# Patient Record
Sex: Male | Born: 1935 | Race: White | Hispanic: No | Smoking: Never smoker
Health system: Southern US, Community
[De-identification: ages and names within clinical notes are randomized; demographics above are authoritative.]

## PROBLEM LIST (undated history)

## (undated) DIAGNOSIS — R55 Syncope and collapse: Secondary | ICD-10-CM

## (undated) DIAGNOSIS — R7989 Other specified abnormal findings of blood chemistry: Secondary | ICD-10-CM

## (undated) DIAGNOSIS — F0390 Unspecified dementia without behavioral disturbance: Secondary | ICD-10-CM

## (undated) DIAGNOSIS — N182 Chronic kidney disease, stage 2 (mild): Secondary | ICD-10-CM

## (undated) DIAGNOSIS — I5032 Chronic diastolic (congestive) heart failure: Secondary | ICD-10-CM

## (undated) DIAGNOSIS — J449 Chronic obstructive pulmonary disease, unspecified: Secondary | ICD-10-CM

## (undated) DIAGNOSIS — N289 Disorder of kidney and ureter, unspecified: Secondary | ICD-10-CM

## (undated) DIAGNOSIS — Z9289 Personal history of other medical treatment: Secondary | ICD-10-CM

## (undated) DIAGNOSIS — I2699 Other pulmonary embolism without acute cor pulmonale: Secondary | ICD-10-CM

## (undated) DIAGNOSIS — I4891 Unspecified atrial fibrillation: Secondary | ICD-10-CM

## (undated) HISTORY — DX: Chronic obstructive pulmonary disease, unspecified: J44.9

---

## 1999-02-01 ENCOUNTER — Other Ambulatory Visit: Admission: RE | Admit: 1999-02-01 | Discharge: 1999-02-01 | Payer: Self-pay | Admitting: Urology

## 1999-02-25 ENCOUNTER — Encounter: Admission: RE | Admit: 1999-02-25 | Discharge: 1999-05-26 | Payer: Self-pay | Admitting: Radiation Oncology

## 1999-04-30 ENCOUNTER — Encounter: Payer: Self-pay | Admitting: Urology

## 1999-04-30 ENCOUNTER — Encounter: Admission: RE | Admit: 1999-04-30 | Discharge: 1999-04-30 | Payer: Self-pay | Admitting: Urology

## 1999-05-07 ENCOUNTER — Encounter: Payer: Self-pay | Admitting: Urology

## 1999-05-07 ENCOUNTER — Ambulatory Visit (HOSPITAL_BASED_OUTPATIENT_CLINIC_OR_DEPARTMENT_OTHER): Admission: RE | Admit: 1999-05-07 | Discharge: 1999-05-07 | Payer: Self-pay | Admitting: Urology

## 1999-05-28 ENCOUNTER — Encounter: Admission: RE | Admit: 1999-05-28 | Discharge: 1999-08-26 | Payer: Self-pay | Admitting: Radiation Oncology

## 2000-09-02 ENCOUNTER — Encounter (INDEPENDENT_AMBULATORY_CARE_PROVIDER_SITE_OTHER): Payer: Self-pay | Admitting: Specialist

## 2000-09-02 ENCOUNTER — Ambulatory Visit (HOSPITAL_COMMUNITY): Admission: RE | Admit: 2000-09-02 | Discharge: 2000-09-02 | Payer: Self-pay | Admitting: *Deleted

## 2000-10-30 ENCOUNTER — Encounter: Payer: Self-pay | Admitting: General Surgery

## 2000-11-03 ENCOUNTER — Observation Stay (HOSPITAL_COMMUNITY): Admission: RE | Admit: 2000-11-03 | Discharge: 2000-11-04 | Payer: Self-pay | Admitting: General Surgery

## 2001-11-29 ENCOUNTER — Emergency Department (HOSPITAL_COMMUNITY): Admission: EM | Admit: 2001-11-29 | Discharge: 2001-11-30 | Payer: Self-pay | Admitting: Emergency Medicine

## 2001-11-30 ENCOUNTER — Encounter: Payer: Self-pay | Admitting: Emergency Medicine

## 2008-12-21 ENCOUNTER — Ambulatory Visit (HOSPITAL_COMMUNITY): Admission: RE | Admit: 2008-12-21 | Discharge: 2008-12-21 | Payer: Self-pay

## 2008-12-21 ENCOUNTER — Encounter (INDEPENDENT_AMBULATORY_CARE_PROVIDER_SITE_OTHER): Payer: Self-pay | Admitting: Family Medicine

## 2010-08-16 NOTE — Procedures (Signed)
[place]. [facility]  Patient:    Craig Bennett, Craig Bennett                        MRN: [id number] Proc. Date: 09/02/00 Adm. Date:  [id number] Attending:  Sabino Gasser                           Procedure Report  PROCEDURE PERFORMED:  Colonoscopy.  ENDOSCOPIST:  Sabino Gasser, M.D.  INDICATIONS FOR PROCEDURE:  Gastrointestinal bleeding.  ANESTHESIA:  None given at patients request.  The patient did not have a ride home.  DESCRIPTION OF PROCEDURE:  With the patient in the left lateral decubitus position and subsequently rolled to his back, his right side, back and various positions with abdominal pressure applied in various locations, the Olympus videoscopic pediatric colonoscope variable stiffness PCF160 was inserted in the rectum after rectal examination was unremarkable and passed under direct vision to what appeared to be the right colon near the hepatic flexure but we could not get past this point in spite of abdominal pressure.  The colonoscope continued to loop and was not able to be passed.  The endoscope was slowly withdrawn at the patients request, taking circumferential views of the entire colonic mucosa stopping only in the rectum where the area was slightly bloody which may have been from hemorrhoids or from endoscope trauma to this area. At any rate, we biopsied it to rule out radiation proctitis.  The endoscope was straightened and withdrawn.  Patients vital signs and pulse oximeter remained stable.  The patient tolerated the procedure well and without apparent complications.  FINDINGS:  Hemorrhoids.  Blood in rectal vault, scant amount, may have been trauma but biopsies taken to rule out radiation proctitis.  The patient will call me for results and follow up with me as an outpatient.  Consider barium enema. DD:  09/02/00 TD:  09/02/00 Job: [id number]

## 2010-08-16 NOTE — Op Note (Signed)
[facility]  Patient:    Craig Bennett, Craig Bennett                        MRN: [id number] Proc. Date: 11/03/00 Adm. Date:  [id number] Attending:  Arlis Porta CC:         Veverly Fells. Vernie Ammons, M.D.  Jaclyn Prime. Lucas Mallow, M.D.   Operative Report  PREOPERATIVE DIAGNOSIS:  Bilateral inguinal hernia.  POSTOPERATIVE DIAGNOSIS:  Bilateral direct inguinal hernia.  PROCEDURE:  Laparoscopic repair of bilateral direct inguinal hernia with mesh.  SURGEON:  Adolph Pollack, M.D.  ANESTHESIA:  General.  INDICATIONS FOR PROCEDURE:  Mr. Albarran is a 77 year old male with chronic atrial fibrillation who noted a bulge in the right inguinal area that was causing him some pain. On examination, he has not only an obvious right inguinal hernia but also a left inguinal hernia. He was admitted for elective repair.  TECHNIQUE:  He was placed supine on the operating table and a general anesthetic was administered. A Foley catheter was placed in his bladder. His lower abdomen was sterilely prepped and draped. A local anesthetic consisting of Marcaine was infiltrated in the subumbilical region and a transverse subumbilical incision was made incising the skin and subcutaneous tissue sharply. The right anterior rectus sheath was identified and a 1-1.5 cm longitudinal incision was made exposing the underlying rectus muscle. The underlying rectus muscle was swept laterally exposing the posterior rectus sheath and a plane is created into the extraperitoneal space bluntly. A balloon dissection device placed into the extraperitoneal space and under direct vision balloon dissection was performed in the extraperitoneal space inferior to the umbilicus. Once this was done, the balloon was let down, removed and a trocar was placed in the extraperitoneal space and CO2 gas insufflated in order to provide a working space. The laparoscope was then introduced. I started with the right side.  Under  direct vision, two 5 mm trocars were placed in the midline and Coopers ligament was exposed on the right side. There was obvious direct inguinal hernia on the right side and the extra peritoneal fatty contents were reduced. The spermatic cord was identified and no indirect component was noted. The spermatic cord was isolated. The anterior and lateral aspects of the abdominal wall in the infraumbilical region were dissected free and exposed.  Next, I approached the left side. I identified  Coopers ligament and a small direct inguinal hernia. The extraperitoneal fatty contents were reduced from it. Subsequent to this, the spermatic cord was identified, isolated and no indirect defect noted. There was a lipoma at the cord that was reduced. I then exposed the anterior and lateral abdominal walls using blunt dissection. A piece of 4 x 6 inch mesh with a longitudinal slit cut into it was then placed into the left extraperitoneal space and anchored to Coopers ligament in the anterior abdominal wall. The slit was used to create a new internal ring and there was a superior tail and inferior tail with respect to the spermatic cord. The lateral aspect of the mesh was anchored to the anterior abdominal wall. This piece of mesh allowed for adequate coverage of the direct and indirect internal spaces.  Next, a similar size piece of mesh with a longitudinal slit cut into it was placed in the right extraperitoneal space and anchored to  Coopers ligament in the anterior abdominal wall. The spermatic cord was encircled with the slit cut in the mesh and the lateral aspect  was anchored to the anterior abdominal wall. This mesh provided adequate coverage of the direct, indirect, and femoral spaces. Both the hernia spaces were well covered with overlapping mesh.  I then held down the inferolateral aspects of both mesh and released the CO2 gas. Of note was that there was a small tear in the peritoneum and this  was repaired with staples. Once the CO2 gas was released, the trocars were removed.  The right anterior sheath rectus defect was closed with #0 Vicryl suture. The skin incisions were then closed with 4-0 monocryl subcuticular stitches followed by Steri-Strips and sterile dressings.  The patient tolerated the procedure well without any apparent complications. He was taken to the recovery room in satisfactory condition. I talked with Dr. Lucas Mallow, his cardiologist, before this because of his chronic atrial fibrillation and he thought that routine postoperative care would be all that was needed. Mr. Skoog did not have any adequate care at home so he will be staying the night on telemetry. DD:  11/03/00 TD:  11/04/00 Job: [id number]

## 2010-08-16 NOTE — Op Note (Signed)
[place]. [facility]  Patient:    Craig Bennett, NHAN                       MRN: [id number] Proc. Date: 05/07/99 Attending:  Loraine Leriche C. Vernie Ammons, M.D. CC:         Billie Lade, M.D.                           Operative Report  PREOPERATIVE DIAGNOSIS:  Adenocarcinoma of the prostate.  POSTOPERATIVE DIAGNOSIS:  Adenocarcinoma of the prostate.  PROCEDURE:  Radioactive iodine-125 seed implantation.  SURGEON:  Mark C. Vernie Ammons, M.D.  RADIATION ONCOLOGIST:  Billie Lade, M.D.  ANESTHESIA:  General.  DRAINS:  A 16-French Foley catheter.  NUMBER OF SEEDS:  116.  NUMBER OF NEEDLES:  26.  COMPLICATIONS:  None.  INDICATIONS:  The patient is a 77 year old white male with biopsy proven adenocarcinoma of the prostate, Gleason VI from the right lobe with a PSA of 11.8. He is brought to the OR today for radioactive seed implantation after having the risks, benefits, and alternatives to seed implant discussed which are fully outlined in my office notes, and been placed on this chart.  DESCRIPTION OF PROCEDURE:  After informed consent, the patient was brought to the major OR, and placed on the table and administered general anesthesia, and then  moved to modified lithotomy position with the perineum perpendicular to ______. A 16-French Foley catheter was inserted into the bladder.  Dilute contrast was used to fill the balloon, and Real-Time fluoroscopy was positioned, as well as the transrectal ultrasound probe.  6 mHz was used to image the prostate, and it was  placed on stabilizing stand.  The prostate was positioned into a position identical to that of the previous ultrasound mapping, and the seeds were then implanted using the ______ after the prostate was stabilized with two stabilizing needles. Real-Time fluoroscopy and Real-Time ultrasound were used to position the seeds. No complications occurred.  Flexible cystoscopy was then performed after all  seeds were implanted.  I noted the urethra to be intact.  The prostatic urethra revealed no evidence of seeds. However, there was a Vicryl strand protruding from the base of the prostate. There was a small medium lobe component, and therefore, the seed were protruding. There were grasped with forceps through the flexible scope and removed.  The strand contained six seeds.  The remainder of the bladder was inspected and had 1-2+ trabeculation, but no tumor, stones, or inflammatory lesions were seen.  The scope was retroflexed and the base of the prostate revealed no further evidence of seeds.  The cystoscope was removed and a 16-French Foley catheter was reinserted in the  bladder.  It will remain indwelling for 24 hours and then be removed.  Rectal exam was performed and revealed no evidence of seeds within the rectum.  The patient was given a prescription for Vicodin ES #28 and Cipro 500 mg #14, and will follow up in my office, as well as with Dr. Roselind Messier in three weeks. DD:  05/07/99 TD:  05/07/99 Job: [id number]

## 2011-10-27 DIAGNOSIS — I4891 Unspecified atrial fibrillation: Secondary | ICD-10-CM | POA: Diagnosis not present

## 2012-10-28 ENCOUNTER — Other Ambulatory Visit: Payer: Self-pay | Admitting: *Deleted

## 2012-10-28 MED ORDER — AMIODARONE HCL 200 MG PO TABS: 200.0000 mg | ORAL_TABLET | Freq: Every day | ORAL | Status: DC

## 2012-10-28 NOTE — Telephone Encounter (Signed)
Rx was sent to pharmacy electronically. 

## 2012-11-19 ENCOUNTER — Telehealth: Payer: Self-pay | Admitting: Cardiovascular Disease

## 2012-11-19 NOTE — Telephone Encounter (Signed)
Returned call.  No answer/voicemail.  Will await call back.

## 2012-11-19 NOTE — Telephone Encounter (Signed)
Returned call.  Line busy x 2.  Will try again later.  

## 2012-11-19 NOTE — Telephone Encounter (Signed)
Message from answering service from yesterday at 5:11 P.-Pt states he wants to talk to the doctor or nurse.They say they couldn't gett a reason for the  call.

## 2013-01-26 ENCOUNTER — Other Ambulatory Visit: Payer: Self-pay | Admitting: Cardiovascular Disease

## 2013-03-18 ENCOUNTER — Other Ambulatory Visit: Payer: Self-pay | Admitting: Cardiology

## 2013-03-22 NOTE — Telephone Encounter (Signed)
Rx was sent to pharmacy electronically. 

## 2013-04-14 DIAGNOSIS — IMO0002 Reserved for concepts with insufficient information to code with codable children: Secondary | ICD-10-CM | POA: Diagnosis not present

## 2013-04-14 DIAGNOSIS — S62319A Displaced fracture of base of unspecified metacarpal bone, initial encounter for closed fracture: Secondary | ICD-10-CM | POA: Diagnosis not present

## 2013-05-27 DIAGNOSIS — IMO0002 Reserved for concepts with insufficient information to code with codable children: Secondary | ICD-10-CM | POA: Diagnosis not present

## 2013-07-11 DIAGNOSIS — I10 Essential (primary) hypertension: Secondary | ICD-10-CM | POA: Diagnosis not present

## 2013-07-11 DIAGNOSIS — Z7901 Long term (current) use of anticoagulants: Secondary | ICD-10-CM | POA: Diagnosis not present

## 2013-07-11 DIAGNOSIS — I4891 Unspecified atrial fibrillation: Secondary | ICD-10-CM | POA: Diagnosis not present

## 2013-09-01 DIAGNOSIS — M810 Age-related osteoporosis without current pathological fracture: Secondary | ICD-10-CM | POA: Diagnosis not present

## 2013-09-01 DIAGNOSIS — I4891 Unspecified atrial fibrillation: Secondary | ICD-10-CM | POA: Diagnosis not present

## 2013-09-01 DIAGNOSIS — I10 Essential (primary) hypertension: Secondary | ICD-10-CM | POA: Diagnosis not present

## 2013-09-01 DIAGNOSIS — Z1331 Encounter for screening for depression: Secondary | ICD-10-CM | POA: Diagnosis not present

## 2013-09-01 DIAGNOSIS — Z0000 Encounter for general adult medical examination without abnormal findings: Secondary | ICD-10-CM | POA: Diagnosis not present

## 2013-09-01 DIAGNOSIS — N182 Chronic kidney disease, stage 2 (mild): Secondary | ICD-10-CM | POA: Diagnosis not present

## 2013-11-07 DIAGNOSIS — Z7901 Long term (current) use of anticoagulants: Secondary | ICD-10-CM | POA: Diagnosis not present

## 2013-11-07 DIAGNOSIS — I4891 Unspecified atrial fibrillation: Secondary | ICD-10-CM | POA: Diagnosis not present

## 2014-02-07 DIAGNOSIS — I482 Chronic atrial fibrillation: Secondary | ICD-10-CM | POA: Diagnosis not present

## 2014-02-07 DIAGNOSIS — Z7901 Long term (current) use of anticoagulants: Secondary | ICD-10-CM | POA: Diagnosis not present

## 2014-03-09 DIAGNOSIS — I482 Chronic atrial fibrillation: Secondary | ICD-10-CM | POA: Diagnosis not present

## 2014-03-09 DIAGNOSIS — Z7901 Long term (current) use of anticoagulants: Secondary | ICD-10-CM | POA: Diagnosis not present

## 2014-03-16 DIAGNOSIS — N182 Chronic kidney disease, stage 2 (mild): Secondary | ICD-10-CM | POA: Diagnosis not present

## 2014-03-16 DIAGNOSIS — Z125 Encounter for screening for malignant neoplasm of prostate: Secondary | ICD-10-CM | POA: Diagnosis not present

## 2014-03-16 DIAGNOSIS — Z8546 Personal history of malignant neoplasm of prostate: Secondary | ICD-10-CM | POA: Diagnosis not present

## 2014-03-16 DIAGNOSIS — I480 Paroxysmal atrial fibrillation: Secondary | ICD-10-CM | POA: Diagnosis not present

## 2014-03-16 DIAGNOSIS — M810 Age-related osteoporosis without current pathological fracture: Secondary | ICD-10-CM | POA: Diagnosis not present

## 2014-04-05 DIAGNOSIS — H25099 Other age-related incipient cataract, unspecified eye: Secondary | ICD-10-CM | POA: Diagnosis not present

## 2014-04-06 DIAGNOSIS — I482 Chronic atrial fibrillation: Secondary | ICD-10-CM | POA: Diagnosis not present

## 2014-04-06 DIAGNOSIS — Z7901 Long term (current) use of anticoagulants: Secondary | ICD-10-CM | POA: Diagnosis not present

## 2014-05-04 DIAGNOSIS — Z7901 Long term (current) use of anticoagulants: Secondary | ICD-10-CM | POA: Diagnosis not present

## 2014-05-04 DIAGNOSIS — I480 Paroxysmal atrial fibrillation: Secondary | ICD-10-CM | POA: Diagnosis not present

## 2014-06-15 DIAGNOSIS — I482 Chronic atrial fibrillation: Secondary | ICD-10-CM | POA: Diagnosis not present

## 2014-06-15 DIAGNOSIS — Z7901 Long term (current) use of anticoagulants: Secondary | ICD-10-CM | POA: Diagnosis not present

## 2014-06-20 DIAGNOSIS — M810 Age-related osteoporosis without current pathological fracture: Secondary | ICD-10-CM | POA: Diagnosis not present

## 2014-06-20 DIAGNOSIS — H6120 Impacted cerumen, unspecified ear: Secondary | ICD-10-CM | POA: Diagnosis not present

## 2014-06-20 DIAGNOSIS — F0390 Unspecified dementia without behavioral disturbance: Secondary | ICD-10-CM | POA: Diagnosis not present

## 2014-06-20 DIAGNOSIS — I480 Paroxysmal atrial fibrillation: Secondary | ICD-10-CM | POA: Diagnosis not present

## 2014-06-29 DIAGNOSIS — Z7901 Long term (current) use of anticoagulants: Secondary | ICD-10-CM | POA: Diagnosis not present

## 2014-06-29 DIAGNOSIS — I482 Chronic atrial fibrillation: Secondary | ICD-10-CM | POA: Diagnosis not present

## 2014-06-29 DIAGNOSIS — F0390 Unspecified dementia without behavioral disturbance: Secondary | ICD-10-CM | POA: Diagnosis not present

## 2014-06-29 DIAGNOSIS — I10 Essential (primary) hypertension: Secondary | ICD-10-CM | POA: Diagnosis not present

## 2014-06-29 DIAGNOSIS — Z9181 History of falling: Secondary | ICD-10-CM | POA: Diagnosis not present

## 2014-06-29 DIAGNOSIS — Z5181 Encounter for therapeutic drug level monitoring: Secondary | ICD-10-CM | POA: Diagnosis not present

## 2014-07-03 DIAGNOSIS — Z9181 History of falling: Secondary | ICD-10-CM | POA: Diagnosis not present

## 2014-07-03 DIAGNOSIS — Z5181 Encounter for therapeutic drug level monitoring: Secondary | ICD-10-CM | POA: Diagnosis not present

## 2014-07-03 DIAGNOSIS — F0390 Unspecified dementia without behavioral disturbance: Secondary | ICD-10-CM | POA: Diagnosis not present

## 2014-07-03 DIAGNOSIS — I10 Essential (primary) hypertension: Secondary | ICD-10-CM | POA: Diagnosis not present

## 2014-07-03 DIAGNOSIS — Z7901 Long term (current) use of anticoagulants: Secondary | ICD-10-CM | POA: Diagnosis not present

## 2014-07-03 DIAGNOSIS — I482 Chronic atrial fibrillation: Secondary | ICD-10-CM | POA: Diagnosis not present

## 2014-07-06 DIAGNOSIS — Z9181 History of falling: Secondary | ICD-10-CM | POA: Diagnosis not present

## 2014-07-06 DIAGNOSIS — I482 Chronic atrial fibrillation: Secondary | ICD-10-CM | POA: Diagnosis not present

## 2014-07-06 DIAGNOSIS — Z5181 Encounter for therapeutic drug level monitoring: Secondary | ICD-10-CM | POA: Diagnosis not present

## 2014-07-06 DIAGNOSIS — Z7901 Long term (current) use of anticoagulants: Secondary | ICD-10-CM | POA: Diagnosis not present

## 2014-07-06 DIAGNOSIS — I10 Essential (primary) hypertension: Secondary | ICD-10-CM | POA: Diagnosis not present

## 2014-07-06 DIAGNOSIS — F0390 Unspecified dementia without behavioral disturbance: Secondary | ICD-10-CM | POA: Diagnosis not present

## 2014-07-10 DIAGNOSIS — Z9181 History of falling: Secondary | ICD-10-CM | POA: Diagnosis not present

## 2014-07-10 DIAGNOSIS — Z7901 Long term (current) use of anticoagulants: Secondary | ICD-10-CM | POA: Diagnosis not present

## 2014-07-10 DIAGNOSIS — Z5181 Encounter for therapeutic drug level monitoring: Secondary | ICD-10-CM | POA: Diagnosis not present

## 2014-07-10 DIAGNOSIS — I482 Chronic atrial fibrillation: Secondary | ICD-10-CM | POA: Diagnosis not present

## 2014-07-10 DIAGNOSIS — F0390 Unspecified dementia without behavioral disturbance: Secondary | ICD-10-CM | POA: Diagnosis not present

## 2014-07-10 DIAGNOSIS — I10 Essential (primary) hypertension: Secondary | ICD-10-CM | POA: Diagnosis not present

## 2014-07-11 DIAGNOSIS — I10 Essential (primary) hypertension: Secondary | ICD-10-CM | POA: Diagnosis not present

## 2014-07-11 DIAGNOSIS — F0390 Unspecified dementia without behavioral disturbance: Secondary | ICD-10-CM | POA: Diagnosis not present

## 2014-07-11 DIAGNOSIS — Z7901 Long term (current) use of anticoagulants: Secondary | ICD-10-CM | POA: Diagnosis not present

## 2014-07-11 DIAGNOSIS — Z5181 Encounter for therapeutic drug level monitoring: Secondary | ICD-10-CM | POA: Diagnosis not present

## 2014-07-11 DIAGNOSIS — I482 Chronic atrial fibrillation: Secondary | ICD-10-CM | POA: Diagnosis not present

## 2014-07-11 DIAGNOSIS — Z9181 History of falling: Secondary | ICD-10-CM | POA: Diagnosis not present

## 2014-07-12 DIAGNOSIS — Z9181 History of falling: Secondary | ICD-10-CM | POA: Diagnosis not present

## 2014-07-12 DIAGNOSIS — Z7901 Long term (current) use of anticoagulants: Secondary | ICD-10-CM | POA: Diagnosis not present

## 2014-07-12 DIAGNOSIS — I10 Essential (primary) hypertension: Secondary | ICD-10-CM | POA: Diagnosis not present

## 2014-07-12 DIAGNOSIS — F0390 Unspecified dementia without behavioral disturbance: Secondary | ICD-10-CM | POA: Diagnosis not present

## 2014-07-12 DIAGNOSIS — Z5181 Encounter for therapeutic drug level monitoring: Secondary | ICD-10-CM | POA: Diagnosis not present

## 2014-07-12 DIAGNOSIS — I482 Chronic atrial fibrillation: Secondary | ICD-10-CM | POA: Diagnosis not present

## 2014-07-17 DIAGNOSIS — Z7901 Long term (current) use of anticoagulants: Secondary | ICD-10-CM | POA: Diagnosis not present

## 2014-07-17 DIAGNOSIS — I10 Essential (primary) hypertension: Secondary | ICD-10-CM | POA: Diagnosis not present

## 2014-07-17 DIAGNOSIS — F0390 Unspecified dementia without behavioral disturbance: Secondary | ICD-10-CM | POA: Diagnosis not present

## 2014-07-17 DIAGNOSIS — Z5181 Encounter for therapeutic drug level monitoring: Secondary | ICD-10-CM | POA: Diagnosis not present

## 2014-07-17 DIAGNOSIS — I482 Chronic atrial fibrillation: Secondary | ICD-10-CM | POA: Diagnosis not present

## 2014-07-17 DIAGNOSIS — Z9181 History of falling: Secondary | ICD-10-CM | POA: Diagnosis not present

## 2014-07-24 DIAGNOSIS — Z9181 History of falling: Secondary | ICD-10-CM | POA: Diagnosis not present

## 2014-07-24 DIAGNOSIS — Z5181 Encounter for therapeutic drug level monitoring: Secondary | ICD-10-CM | POA: Diagnosis not present

## 2014-07-24 DIAGNOSIS — I482 Chronic atrial fibrillation: Secondary | ICD-10-CM | POA: Diagnosis not present

## 2014-07-24 DIAGNOSIS — F0390 Unspecified dementia without behavioral disturbance: Secondary | ICD-10-CM | POA: Diagnosis not present

## 2014-07-24 DIAGNOSIS — Z7901 Long term (current) use of anticoagulants: Secondary | ICD-10-CM | POA: Diagnosis not present

## 2014-07-24 DIAGNOSIS — I10 Essential (primary) hypertension: Secondary | ICD-10-CM | POA: Diagnosis not present

## 2014-07-31 DIAGNOSIS — F0390 Unspecified dementia without behavioral disturbance: Secondary | ICD-10-CM | POA: Diagnosis not present

## 2014-07-31 DIAGNOSIS — I10 Essential (primary) hypertension: Secondary | ICD-10-CM | POA: Diagnosis not present

## 2014-07-31 DIAGNOSIS — Z5181 Encounter for therapeutic drug level monitoring: Secondary | ICD-10-CM | POA: Diagnosis not present

## 2014-07-31 DIAGNOSIS — Z7901 Long term (current) use of anticoagulants: Secondary | ICD-10-CM | POA: Diagnosis not present

## 2014-07-31 DIAGNOSIS — Z9181 History of falling: Secondary | ICD-10-CM | POA: Diagnosis not present

## 2014-07-31 DIAGNOSIS — I482 Chronic atrial fibrillation: Secondary | ICD-10-CM | POA: Diagnosis not present

## 2014-08-01 DIAGNOSIS — Z5181 Encounter for therapeutic drug level monitoring: Secondary | ICD-10-CM | POA: Diagnosis not present

## 2014-08-01 DIAGNOSIS — Z9181 History of falling: Secondary | ICD-10-CM | POA: Diagnosis not present

## 2014-08-01 DIAGNOSIS — I482 Chronic atrial fibrillation: Secondary | ICD-10-CM | POA: Diagnosis not present

## 2014-08-01 DIAGNOSIS — Z7901 Long term (current) use of anticoagulants: Secondary | ICD-10-CM | POA: Diagnosis not present

## 2014-08-01 DIAGNOSIS — I10 Essential (primary) hypertension: Secondary | ICD-10-CM | POA: Diagnosis not present

## 2014-08-01 DIAGNOSIS — F0390 Unspecified dementia without behavioral disturbance: Secondary | ICD-10-CM | POA: Diagnosis not present

## 2014-08-02 DIAGNOSIS — F0390 Unspecified dementia without behavioral disturbance: Secondary | ICD-10-CM | POA: Diagnosis not present

## 2014-08-02 DIAGNOSIS — I10 Essential (primary) hypertension: Secondary | ICD-10-CM | POA: Diagnosis not present

## 2014-08-02 DIAGNOSIS — Z7901 Long term (current) use of anticoagulants: Secondary | ICD-10-CM | POA: Diagnosis not present

## 2014-08-02 DIAGNOSIS — Z5181 Encounter for therapeutic drug level monitoring: Secondary | ICD-10-CM | POA: Diagnosis not present

## 2014-08-02 DIAGNOSIS — I482 Chronic atrial fibrillation: Secondary | ICD-10-CM | POA: Diagnosis not present

## 2014-08-02 DIAGNOSIS — Z9181 History of falling: Secondary | ICD-10-CM | POA: Diagnosis not present

## 2014-08-03 DIAGNOSIS — I480 Paroxysmal atrial fibrillation: Secondary | ICD-10-CM | POA: Diagnosis not present

## 2014-08-03 DIAGNOSIS — Z7901 Long term (current) use of anticoagulants: Secondary | ICD-10-CM | POA: Diagnosis not present

## 2014-08-07 DIAGNOSIS — Z5181 Encounter for therapeutic drug level monitoring: Secondary | ICD-10-CM | POA: Diagnosis not present

## 2014-08-07 DIAGNOSIS — Z7901 Long term (current) use of anticoagulants: Secondary | ICD-10-CM | POA: Diagnosis not present

## 2014-08-07 DIAGNOSIS — F0390 Unspecified dementia without behavioral disturbance: Secondary | ICD-10-CM | POA: Diagnosis not present

## 2014-08-07 DIAGNOSIS — I10 Essential (primary) hypertension: Secondary | ICD-10-CM | POA: Diagnosis not present

## 2014-08-07 DIAGNOSIS — I482 Chronic atrial fibrillation: Secondary | ICD-10-CM | POA: Diagnosis not present

## 2014-08-07 DIAGNOSIS — Z9181 History of falling: Secondary | ICD-10-CM | POA: Diagnosis not present

## 2014-08-09 DIAGNOSIS — Z5181 Encounter for therapeutic drug level monitoring: Secondary | ICD-10-CM | POA: Diagnosis not present

## 2014-08-09 DIAGNOSIS — Z9181 History of falling: Secondary | ICD-10-CM | POA: Diagnosis not present

## 2014-08-09 DIAGNOSIS — Z7901 Long term (current) use of anticoagulants: Secondary | ICD-10-CM | POA: Diagnosis not present

## 2014-08-09 DIAGNOSIS — I482 Chronic atrial fibrillation: Secondary | ICD-10-CM | POA: Diagnosis not present

## 2014-08-09 DIAGNOSIS — F0390 Unspecified dementia without behavioral disturbance: Secondary | ICD-10-CM | POA: Diagnosis not present

## 2014-08-09 DIAGNOSIS — I10 Essential (primary) hypertension: Secondary | ICD-10-CM | POA: Diagnosis not present

## 2014-08-11 DIAGNOSIS — Z7901 Long term (current) use of anticoagulants: Secondary | ICD-10-CM | POA: Diagnosis not present

## 2014-08-11 DIAGNOSIS — G309 Alzheimer's disease, unspecified: Secondary | ICD-10-CM | POA: Diagnosis not present

## 2014-08-15 DIAGNOSIS — F0390 Unspecified dementia without behavioral disturbance: Secondary | ICD-10-CM | POA: Diagnosis not present

## 2014-08-15 DIAGNOSIS — Z5181 Encounter for therapeutic drug level monitoring: Secondary | ICD-10-CM | POA: Diagnosis not present

## 2014-08-15 DIAGNOSIS — I482 Chronic atrial fibrillation: Secondary | ICD-10-CM | POA: Diagnosis not present

## 2014-08-15 DIAGNOSIS — Z9181 History of falling: Secondary | ICD-10-CM | POA: Diagnosis not present

## 2014-08-15 DIAGNOSIS — Z7901 Long term (current) use of anticoagulants: Secondary | ICD-10-CM | POA: Diagnosis not present

## 2014-08-15 DIAGNOSIS — I10 Essential (primary) hypertension: Secondary | ICD-10-CM | POA: Diagnosis not present

## 2014-08-16 DIAGNOSIS — Z9181 History of falling: Secondary | ICD-10-CM | POA: Diagnosis not present

## 2014-08-16 DIAGNOSIS — F0390 Unspecified dementia without behavioral disturbance: Secondary | ICD-10-CM | POA: Diagnosis not present

## 2014-08-16 DIAGNOSIS — Z7901 Long term (current) use of anticoagulants: Secondary | ICD-10-CM | POA: Diagnosis not present

## 2014-08-16 DIAGNOSIS — I482 Chronic atrial fibrillation: Secondary | ICD-10-CM | POA: Diagnosis not present

## 2014-08-16 DIAGNOSIS — Z5181 Encounter for therapeutic drug level monitoring: Secondary | ICD-10-CM | POA: Diagnosis not present

## 2014-08-16 DIAGNOSIS — I10 Essential (primary) hypertension: Secondary | ICD-10-CM | POA: Diagnosis not present

## 2014-08-17 DIAGNOSIS — Z7901 Long term (current) use of anticoagulants: Secondary | ICD-10-CM | POA: Diagnosis not present

## 2014-08-17 DIAGNOSIS — Z8546 Personal history of malignant neoplasm of prostate: Secondary | ICD-10-CM | POA: Diagnosis not present

## 2014-08-29 DIAGNOSIS — Z7901 Long term (current) use of anticoagulants: Secondary | ICD-10-CM | POA: Diagnosis not present

## 2014-08-29 DIAGNOSIS — I480 Paroxysmal atrial fibrillation: Secondary | ICD-10-CM | POA: Diagnosis not present

## 2014-09-15 DIAGNOSIS — Z1389 Encounter for screening for other disorder: Secondary | ICD-10-CM | POA: Diagnosis not present

## 2014-09-15 DIAGNOSIS — M810 Age-related osteoporosis without current pathological fracture: Secondary | ICD-10-CM | POA: Diagnosis not present

## 2014-09-15 DIAGNOSIS — Z0000 Encounter for general adult medical examination without abnormal findings: Secondary | ICD-10-CM | POA: Diagnosis not present

## 2014-09-15 DIAGNOSIS — Z125 Encounter for screening for malignant neoplasm of prostate: Secondary | ICD-10-CM | POA: Diagnosis not present

## 2014-09-15 DIAGNOSIS — R5383 Other fatigue: Secondary | ICD-10-CM | POA: Diagnosis not present

## 2014-09-15 DIAGNOSIS — I10 Essential (primary) hypertension: Secondary | ICD-10-CM | POA: Diagnosis not present

## 2014-09-15 DIAGNOSIS — I480 Paroxysmal atrial fibrillation: Secondary | ICD-10-CM | POA: Diagnosis not present

## 2014-09-15 DIAGNOSIS — I129 Hypertensive chronic kidney disease with stage 1 through stage 4 chronic kidney disease, or unspecified chronic kidney disease: Secondary | ICD-10-CM | POA: Diagnosis not present

## 2014-09-22 DIAGNOSIS — I4891 Unspecified atrial fibrillation: Secondary | ICD-10-CM | POA: Diagnosis not present

## 2014-09-22 DIAGNOSIS — I129 Hypertensive chronic kidney disease with stage 1 through stage 4 chronic kidney disease, or unspecified chronic kidney disease: Secondary | ICD-10-CM | POA: Diagnosis not present

## 2014-09-22 DIAGNOSIS — N182 Chronic kidney disease, stage 2 (mild): Secondary | ICD-10-CM | POA: Diagnosis not present

## 2014-09-22 DIAGNOSIS — I5032 Chronic diastolic (congestive) heart failure: Secondary | ICD-10-CM | POA: Diagnosis not present

## 2014-10-22 ENCOUNTER — Encounter (HOSPITAL_COMMUNITY): Payer: Self-pay | Admitting: Emergency Medicine

## 2014-10-22 ENCOUNTER — Inpatient Hospital Stay (HOSPITAL_COMMUNITY)
Admission: EM | Admit: 2014-10-22 | Discharge: 2014-10-25 | DRG: 309 | Disposition: A | Discharge status: Home / Self Care | Payer: Medicare Other | Attending: Internal Medicine | Admitting: Internal Medicine

## 2014-10-22 DIAGNOSIS — I5032 Chronic diastolic (congestive) heart failure: Secondary | ICD-10-CM | POA: Diagnosis present

## 2014-10-22 DIAGNOSIS — N183 Chronic kidney disease, stage 3 unspecified: Secondary | ICD-10-CM | POA: Diagnosis present

## 2014-10-22 DIAGNOSIS — Z7901 Long term (current) use of anticoagulants: Secondary | ICD-10-CM | POA: Diagnosis not present

## 2014-10-22 DIAGNOSIS — I4891 Unspecified atrial fibrillation: Secondary | ICD-10-CM | POA: Insufficient documentation

## 2014-10-22 DIAGNOSIS — I951 Orthostatic hypotension: Secondary | ICD-10-CM | POA: Diagnosis present

## 2014-10-22 DIAGNOSIS — Z8546 Personal history of malignant neoplasm of prostate: Secondary | ICD-10-CM

## 2014-10-22 DIAGNOSIS — I482 Chronic atrial fibrillation, unspecified: Secondary | ICD-10-CM | POA: Diagnosis present

## 2014-10-22 DIAGNOSIS — R404 Transient alteration of awareness: Secondary | ICD-10-CM | POA: Diagnosis not present

## 2014-10-22 DIAGNOSIS — N179 Acute kidney failure, unspecified: Secondary | ICD-10-CM

## 2014-10-22 DIAGNOSIS — R001 Bradycardia, unspecified: Principal | ICD-10-CM | POA: Diagnosis present

## 2014-10-22 DIAGNOSIS — F0390 Unspecified dementia without behavioral disturbance: Secondary | ICD-10-CM | POA: Diagnosis not present

## 2014-10-22 DIAGNOSIS — R55 Syncope and collapse: Secondary | ICD-10-CM | POA: Diagnosis not present

## 2014-10-22 DIAGNOSIS — E869 Volume depletion, unspecified: Secondary | ICD-10-CM | POA: Diagnosis present

## 2014-10-22 DIAGNOSIS — R7989 Other specified abnormal findings of blood chemistry: Secondary | ICD-10-CM | POA: Diagnosis present

## 2014-10-22 DIAGNOSIS — R42 Dizziness and giddiness: Secondary | ICD-10-CM | POA: Diagnosis not present

## 2014-10-22 HISTORY — DX: Personal history of other medical treatment: Z92.89

## 2014-10-22 HISTORY — DX: Unspecified dementia, unspecified severity, without behavioral disturbance, psychotic disturbance, mood disturbance, and anxiety: F03.90

## 2014-10-22 HISTORY — DX: Chronic diastolic (congestive) heart failure: I50.32

## 2014-10-22 HISTORY — DX: Unspecified atrial fibrillation: I48.91

## 2014-10-22 HISTORY — DX: Chronic kidney disease, stage 2 (mild): N18.2

## 2014-10-22 HISTORY — DX: Syncope and collapse: R55

## 2014-10-22 HISTORY — DX: Other specified abnormal findings of blood chemistry: R79.89

## 2014-10-22 LAB — TROPONIN I
Troponin I: <0.03 ng/mL (ref <0.031)
Troponin I: <0.03 ng/mL (ref <0.031)
Troponin I: 0.03 ng/mL (ref <0.031)

## 2014-10-22 LAB — BASIC METABOLIC PANEL
ANION GAP: 8 (ref 5–15)
BUN: 15 mg/dL (ref 6–20)
CALCIUM: 10.3 mg/dL (ref 8.9–10.3)
CO2: 27 mmol/L (ref 22–32)
CREATININE: 1.29 mg/dL — AB (ref 0.61–1.24)
Chloride: 107 mmol/L (ref 101–111)
GFR calc non Af Amer: 50 mL/min — ABNORMAL LOW (ref >60)
GFR, EST AFRICAN AMERICAN: 58 mL/min — AB (ref >60)
Glucose, Bld: 120 mg/dL — ABNORMAL HIGH (ref 65–99)
Potassium: 4.2 mmol/L (ref 3.5–5.1)
SODIUM: 142 mmol/L (ref 135–145)

## 2014-10-22 LAB — URINE MICROSCOPIC-ADD ON

## 2014-10-22 LAB — URINALYSIS, ROUTINE W REFLEX MICROSCOPIC
Bilirubin Urine: NEGATIVE
GLUCOSE, UA: NEGATIVE mg/dL
Hgb urine dipstick: NEGATIVE
Ketones, ur: NEGATIVE mg/dL
LEUKOCYTES UA: NEGATIVE
NITRITE: NEGATIVE
PROTEIN: NEGATIVE mg/dL
Specific Gravity, Urine: 1.017 (ref 1.005–1.030)
Urobilinogen, UA: 1 mg/dL (ref 0.0–1.0)
pH: 8.5 — ABNORMAL HIGH (ref 5.0–8.0)

## 2014-10-22 LAB — CBC
HEMATOCRIT: 43.3 % (ref 39.0–52.0)
Hemoglobin: 14.6 g/dL (ref 13.0–17.0)
MCH: 32.9 pg (ref 26.0–34.0)
MCHC: 33.7 g/dL (ref 30.0–36.0)
MCV: 97.5 fL (ref 78.0–100.0)
PLATELETS: 217 10*3/uL (ref 150–400)
RBC: 4.44 MIL/uL (ref 4.22–5.81)
RDW: 13.5 % (ref 11.5–15.5)
WBC: 6.2 10*3/uL (ref 4.0–10.5)

## 2014-10-22 LAB — TSH: TSH: 1.763 u[IU]/mL (ref 0.350–4.500)

## 2014-10-22 LAB — CBG MONITORING, ED: GLUCOSE-CAPILLARY: 123 mg/dL — AB (ref 65–99)

## 2014-10-22 MED ORDER — ONDANSETRON HCL 4 MG/2ML IJ SOLN: 4.0000 mg | Freq: Four times a day (QID) | INTRAMUSCULAR | Status: DC | PRN

## 2014-10-22 MED ORDER — ONDANSETRON HCL 4 MG PO TABS: 4.0000 mg | ORAL_TABLET | Freq: Four times a day (QID) | ORAL | Status: DC | PRN

## 2014-10-22 MED ORDER — ACETAMINOPHEN 325 MG PO TABS: 650.0000 mg | ORAL_TABLET | Freq: Four times a day (QID) | ORAL | Status: DC | PRN

## 2014-10-22 MED ORDER — ENOXAPARIN SODIUM 40 MG/0.4ML ~~LOC~~ SOLN
40.0000 mg | SUBCUTANEOUS | Status: DC
  Administered 2014-10-24: 40 mg via SUBCUTANEOUS
  Filled 2014-10-22 (×4): qty 0.4

## 2014-10-22 MED ORDER — ASPIRIN EC 81 MG PO TBEC
81.0000 mg | DELAYED_RELEASE_TABLET | Freq: Every day | ORAL | Status: DC
  Administered 2014-10-22 – 2014-10-24 (×3): 81 mg via ORAL
  Filled 2014-10-22 (×4): qty 1

## 2014-10-22 MED ORDER — ADULT MULTIVITAMIN W/MINERALS CH
1.0000 | ORAL_TABLET | Freq: Every day | ORAL | Status: DC
  Administered 2014-10-22 – 2014-10-24 (×3): 1 via ORAL
  Filled 2014-10-22 (×4): qty 1

## 2014-10-22 MED ORDER — SODIUM CHLORIDE 0.9 % IV BOLUS (SEPSIS)
1000.0000 mL | Freq: Once | INTRAVENOUS | Status: AC
  Administered 2014-10-22: 1000 mL via INTRAVENOUS

## 2014-10-22 MED ORDER — SODIUM CHLORIDE 0.9 % IJ SOLN
3.0000 mL | Freq: Two times a day (BID) | INTRAMUSCULAR | Status: DC
  Administered 2014-10-23 – 2014-10-25 (×5): 3 mL via INTRAVENOUS

## 2014-10-22 MED ORDER — SODIUM CHLORIDE 0.9 % IV SOLN
INTRAVENOUS | Status: DC
  Administered 2014-10-22 – 2014-10-23 (×2): via INTRAVENOUS

## 2014-10-22 MED ORDER — ALUM & MAG HYDROXIDE-SIMETH 200-200-20 MG/5ML PO SUSP: 30.0000 mL | Freq: Four times a day (QID) | ORAL | Status: DC | PRN

## 2014-10-22 MED ORDER — ACETAMINOPHEN 650 MG RE SUPP: 650.0000 mg | Freq: Four times a day (QID) | RECTAL | Status: DC | PRN

## 2014-10-22 NOTE — Progress Notes (Signed)
Pt arrived to the unit  Requesting to go home, safety contact completed, pt sitting up on the side of the bed. Bed alarm set and patient educated. Dinner tray ordered. Dr Sharl Ma at the bedside.

## 2014-10-22 NOTE — ED Notes (Signed)
Marlin Canary - church friend - [phone] - Will take pt home on d/c.

## 2014-10-22 NOTE — ED Notes (Signed)
Pt from [facility] via [place] with c/o syncopal episode lasting approx 1 min witnessed by the parish.  Pt walked from [facility] to [facility], sat down prior to the episode.  Pt was very diaphoretic on arrival, initial BP was 104 palpated.  Hx afib.  Denies CP, SOB, dizziness, or other complaints.  Pt in NAD, A&O.

## 2014-10-22 NOTE — ED Provider Notes (Signed)
CSN: [id number]     Arrival date & time 10/22/14  1206 History   First MD Initiated Contact with Patient 10/22/14 1234     Chief Complaint  Patient presents with  . Loss of Consciousness     (Consider location/radiation/quality/duration/timing/severity/associated sxs/prior Treatment) The history is provided by the patient.  Craig Bennett is a 81 y.o. male hx of afib on aspirin here with syncope. Patient was in church and was singing in the choir and felt lightheaded and dizzy. Continued to sing and then, without a warning, he passed out and someone next to him caught him and he didn't hit his head. Patient supposed to be on amiodarone but is not taking it. Denies chest pain or shortness of breath. Denies hx of CAD.   Level V caveat- dementia    Past Medical History  Diagnosis Date  . A-fib    History reviewed. No pertinent past surgical history. History reviewed. No pertinent family history. History  Substance Use Topics  . Smoking status: Never Smoker   . Smokeless tobacco: Never Used  . Alcohol Use: Yes     Comment: "very little, occasional wine with food."    Review of Systems  Cardiovascular: Positive for syncope.  Neurological: Positive for dizziness.  All other systems reviewed and are negative.     Allergies  Review of patient's allergies indicates no known allergies.  Home Medications    BP 145/96 mmHg  Pulse 57  Temp(Src) 97.4 F (36.3 C) (Oral)  Resp 15  Ht 6' (1.829 m)  Wt 180 lb (81.647 kg)  BMI 24.41 kg/m2  SpO2 100% Physical Exam  Constitutional: He is oriented to person, place, and time.  Chronically ill   HENT:  Head: Normocephalic.  Mouth/Throat: Oropharynx is clear and moist.  Eyes: Conjunctivae are normal. Pupils are equal, round, and reactive to light.  Neck: Normal range of motion. Neck supple.  Cardiovascular: Regular rhythm and normal heart sounds.   Irregular, bradycardic   Pulmonary/Chest: Effort normal and breath sounds normal.  No respiratory distress. He has no wheezes. He has no rales.  Abdominal: Soft. Bowel sounds are normal. He exhibits no distension. There is no tenderness. There is no rebound.  Musculoskeletal: Normal range of motion. He exhibits no edema or tenderness.  Neurological: He is alert and oriented to person, place, and time. No cranial nerve deficit. Coordination normal.  Skin: Skin is warm and dry.  Psychiatric: He has a normal mood and affect. His behavior is normal. Judgment and thought content normal.  Nursing note and vitals reviewed.   ED Course  Procedures (including critical care time) Labs Review Labs Reviewed  BASIC METABOLIC PANEL - Abnormal; Notable for the following:    Glucose, Bld 120 (*)    Creatinine, Ser 1.29 (*)    GFR calc non Af Amer 50 (*)    GFR calc Af Amer 58 (*)    All other components within normal limits  URINALYSIS, ROUTINE W REFLEX MICROSCOPIC (NOT AT [facility]) - Abnormal; Notable for the following:    APPearance TURBID (*)    pH 8.5 (*)    All other components within normal limits  CBG MONITORING, ED - Abnormal; Notable for the following:    Glucose-Capillary 123 (*)    All other components within normal limits  CBC  TROPONIN I  URINE MICROSCOPIC-ADD ON    Imaging Review No results found.   EKG Interpretation   Date/Time:  Sunday October 22 2014 12:09:05 EDT Ventricular Rate:  51 PR Interval:    QRS Duration: 87 QT Interval:  502 QTC Calculation: 462 R Axis:   45 Text Interpretation:  Atrial fibrillation Ventricular premature complex  Borderline repolarization abnormality afib old Confirmed by YAO  MD, Craig  ([id number]) on 10/22/2014 12:34:58 PM      MDM   Final diagnoses:  None    Craig Bennett is a 82 y.o. male hx of afib here with syncope. Concerned for arrhythmias given afib. Bradycardic to 40s in the ED. Nursing noted some bigeminy on the monitor. Labs unremarkable. Trop neg x 1. EKG showed afib. Will admit to tele for monitoring.      Craig Canal, MD 10/22/14 [phone]

## 2014-10-22 NOTE — ED Notes (Signed)
Admitting MD came out of room stating patient was trying to stand up to use the bathroom. Went in to assist patient use the urinal, patient insisted on standing. Moderate assistance provided. Pt wet his underwear as well and refuses to take it off. Brief offered. Pt refused.

## 2014-10-22 NOTE — H&P (Signed)
[facility] History and Physical                                                                                    Craig Bennett, is a 81 y.o. male  MRN: [id number]   DOB - 29-May-1929  Admit Date - 10/22/2014  Outpatient Primary MD for the patient is No primary care provider on file.  Referring MD: Silverio Lay / ER  With History of -  Past Medical History  Diagnosis Date  . A-fib       History reviewed. No pertinent past surgical history.  in for   Chief Complaint  Patient presents with  . Loss of Consciousness     HPI This is an 81 year old male patient with known chronic atrial fibrillation previously on amiodarone, dementia on Aricept, and self report of recent diagnosis of low testosterone. The patient apparently was at church singing in the choir and felt lightheaded and dizzy but continued to sing when he suddenly collapsed. Fortunately the person next to him caught him and he did not sustain any traumatic injuries. Patient was not sure how long he was "out". He did have awareness of people around him doing things and didn't really come to a telemetry was placed in the ambulance. He denied any prior episodes of syncope. No issues of dizziness or lightheadedness prior to today. No chest pain or shortness of breath. No tachypalpitations. No increasing fatigue or dyspnea on exertion. Patient tells me about 2 months ago his warfarin was discontinued because he was apparently maintaining sinus rhythm and he was switched to aspirin 81 mg. Unclear if primary care physician or cardiologist discontinued this medication. He tells me he was recently diagnosed with low testosterone and has a prescription but has not had this filled; he also endorses issues related to erectile dysfunction.  In the ER patient was afebrile, his pulse was 50, he was in atrial fibrillation. Telemetry also reveals patient breaking down to as low as 45 bpm and also having frequent unifocal PVCs occasionally in a  bigeminy pattern, her pressure was 143/76. Patient was found to be orthostatic with a supine blood pressure of 145/77 and a pulse of 60 with a drop in blood pressure to 126/72 with a pulse of 61 with standing. He was given 1 L 5 he fluids in the ER. Laboratory data was unremarkable except for slightly elevated glucose of 123, creatinine 1.29, calcium 10.3, urinalysis was unremarkable except for turbid appearance but no evidence of UTI. EKG revealed atrial fibrillation with ventricular response of 51 and unifocal PVC, no ischemic changes.   Review of Systems   In addition to the HPI above,  No Fever-chills, myalgias or other constitutional symptoms No Headache, changes with Vision or hearing, new weakness, tingling, numbness in any extremity No problems swallowing food or Liquids, indigestion/reflux No Chest pain, Cough or Shortness of Breath, palpitations, orthopnea or DOE No Abdominal pain, N/V; no melena or hematochezia, no dark tarry stools, Bowel movements are regular, No dysuria, hematuria or flank pain No new skin rashes, lesions, masses or bruises, No new joints pains-aches No recent weight gain or loss No polyuria, polydypsia or polyphagia,  *  A full 10 point Review of Systems was done, except as stated above, all other Review of Systems were negative.  Social History History  Substance Use Topics  . Smoking status: Never Smoker   . Smokeless tobacco: Never Used  . Alcohol Use: Yes     Comment: "very little, occasional wine with food."    Resides at: Private residence  Lives with: Lives alone-does not drive  Ambulatory status: Without assistive devices   Family History History reviewed. No pertinent family history reported. History at times limited by patient's memory issues related to his dementia   Prior to Admission medications   Medication Sig Start Date End Date Taking? Authorizing Provider  amiodarone (PACERONE) 200 MG tablet TAKE ONE TABLET BY MOUTH ONCE DAILY.   NEEDS OFFICE VISIT. Patient taking differently: TAKE ONE TABLET BY MOUTH every evening. 03/18/13  Yes Marykay Lex, MD  aspirin 81 MG tablet Take 81 mg by mouth at bedtime.    Yes Historical Provider, MD  donepezil (ARICEPT) 10 MG tablet Take 10 mg by mouth at bedtime.   Yes Historical Provider, MD  Multiple Vitamins-Minerals (MULTIVITAMIN WITH MINERALS) tablet Take 1 tablet by mouth at bedtime.    Yes Historical Provider, MD    No Known Allergies  Physical Exam  Vitals  Blood pressure 143/81, pulse 49, temperature 97.4 F (36.3 C), temperature source Oral, resp. rate 15, height 6' (1.829 m), weight 180 lb (81.647 kg), SpO2 98 %.   General:  In no acute distress, appears healthy and well nourished  Psych:  Normal affect, Denies Suicidal or Homicidal ideations, Awake Alert, Oriented X 3. Gives convoluted story about why he is not driving anymore stating the car dealership took his car away after he came to their dealership 3 times with questions, has issues with short-term memory loss and recall  Neuro:   No focal neurological deficits, CN II through XII intact, Strength 5/5 all 4 extremities, Sensation intact all 4 extremities.  ENT:  Ears and Eyes appear Normal, Conjunctivae clear, PER. Moist oral mucosa without erythema or exudates.  Neck:  Supple, No lymphadenopathy appreciated  Respiratory:  Symmetrical chest wall movement, Good air movement bilaterally, CTAB. Room Air  Cardiac: Irregular with underlying atrial fibrillation and frequent unifocal PVCs, No Murmurs, no LE edema noted, no JVD, No carotid bruits, peripheral pulses palpable at 2+  Abdomen:  Positive bowel sounds, Soft, Non tender, Non distended,  No masses appreciated, no obvious hepatosplenomegaly  Skin:  No Cyanosis, Normal Skin Turgor, No Skin Rash or Bruise.  Extremities: Symmetrical without obvious trauma or injury,  no effusions.  Data Review  CBC  Recent Labs Lab 10/22/14 1245  WBC 6.2  HGB 14.6    HCT 43.3  PLT 217  MCV 97.5  MCH 32.9  MCHC 33.7  RDW 13.5    Chemistries   Recent Labs Lab 10/22/14 1245  NA 142  K 4.2  CL 107  CO2 27  GLUCOSE 120*  BUN 15  CREATININE 1.29*  CALCIUM 10.3    estimated creatinine clearance is 48.5 mL/min (by C-G formula based on Cr of 1.29).  No results for input(s): TSH, T4TOTAL, T3FREE, THYROIDAB in the last 72 hours.  Invalid input(s): FREET3  Coagulation profile No results for input(s): INR, PROTIME in the last 168 hours.  No results for input(s): DDIMER in the last 72 hours.  Cardiac Enzymes  Recent Labs Lab 10/22/14 1245  TROPONINI <0.03    Invalid input(s): POCBNP  Urinalysis  Component Value Date/Time   COLORURINE YELLOW 10/22/2014 1330   APPEARANCEUR TURBID* 10/22/2014 1330   LABSPEC 1.017 10/22/2014 1330   PHURINE 8.5* 10/22/2014 1330   GLUCOSEU NEGATIVE 10/22/2014 1330   HGBUR NEGATIVE 10/22/2014 1330   BILIRUBINUR NEGATIVE 10/22/2014 1330   KETONESUR NEGATIVE 10/22/2014 1330   PROTEINUR NEGATIVE 10/22/2014 1330   UROBILINOGEN 1.0 10/22/2014 1330   NITRITE NEGATIVE 10/22/2014 1330   LEUKOCYTESUR NEGATIVE 10/22/2014 1330    Imaging results:   No results found.   EKG: (Independently reviewed) H her fibrillation with frequent unifocal PVCs, bradycardic rate, no ischemic changes   Assessment & Plan  Principal Problem:   Syncope -Admit to telemetry -Suspect multifactorial etiology: Orthostasis possibly from volume depletion in setting of likely symptomatic bradycardia -Cardiology has been consulted (see below) -Echocardiogram -Cycle troponin -Aricept can cause syncope (see below)  Active Problems:   Orthostatic hypotension -Uncertain of primarily from volume depletion or for related to symptomatic bradycardia -Was given a liter of IV fluids in the ER and will continue IV fluids at least overnight -Repeat orthostatic vital signs in a.m.    Chronic atrial fibrillation -Patient apparently  self discontinued his amiodarone -Patient reports that 2 months ago warfarin was discontinued in favor of aspirin 81 mg -CHADVASc = 2    Symptomatic bradycardia -Unclear if related to primary conduction issue or secondary to medications -Aricept has been documented to cause AV block, bradycardia, syncope therefore has been discontinued at time of admission    CKD (chronic kidney disease), stage III -Baseline renal function unknown    Dementia -Patient still lives alone -No apparent falls at home and no unexplained bruising -PT evaluation since presented with syncope    Low testosterone -Patient reports this as a new diagnosis but has not filled prescription    History adenocarcinoma of the prostate -Patient does not recall this history and was obtained from the electronic medical record -Patient underwent radioactive iodine seed implantation in 2012    DVT Prophylaxis: Lovenox  Family Communication:   No family at bedside  Code Status: Full code   Condition:  Stable  Discharge disposition: Pending results and evaluation of syncope and bradycardia as well as PT evaluation; patient quite impulsive and has underlying dementia and uncertain of safety of returning to home alone  Time spent in minutes : 60      ELLIS,ALLISON L. ANP on 10/22/2014 at 4:51 PM  Between 7am to 7pm - Pager - [phone]  After 7pm go to [email or web address] - password [email or web address]  And look for the night coverage person covering me after hours  [facility]  I have taken an interval history, reviewed the chart and examined the patient. I agree with the Advanced Practice Provider's note, impression and recommendations. I have made any necessary editorial changes. 81 year old male with history of chronic A. fib, dementia admitted with syncope. Cardiology has seen the patient. Echocardiogram ordered, will cycle troponin. Patient has a history of chronic A. fib and warfarin was discontinued in favor of  aspirin 2 months ago. Follow cardiac enzymes and echo.

## 2014-10-22 NOTE — Consult Note (Signed)
CARDIOLOGY CONSULT NOTE   Patient ID: RASHIDI LOH MRN: [id number] DOB/AGE: Jan 19, 1930 81 y.o.  Admit date: 10/22/2014  Primary Physician   No primary care provider on file. Thayer Headings, MD [facility]) Primary Cardiologist   Dr. Debbe Bales - Unknown place  Reason for Consultation   Syncope  HPI: This is an 81 year old male patient with known chronic atrial fibrillation previously on amiodarone, dementia on Aricept, and self report of recent diagnosis of low testosterone. The patient apparently was at church singing in the choir and felt lightheaded and dizzy but continued to sing when he suddenly collapsed. Fortunately the person next to him caught him and he did not sustain any traumatic injuries. Patient was not sure how long he was "out". He did have awareness of people around him doing things and didn't really come to a telemetry was placed in the ambulance. He denied any prior episodes of syncope. No issues of dizziness or lightheadedness prior to today. No chest pain or shortness of breath. No tachypalpitations. No increasing fatigue or dyspnea on exertion. Patient tells me about 2 months ago his warfarin was discontinued because he was apparently maintaining sinus rhythm and he was switched to aspirin 81 mg. Unclear if primary care physician or cardiologist discontinued this medication. He tells me he was recently diagnosed with low testosterone and has a prescription but has not had this filled; he also endorses issues related to erectile dysfunction.  In the ER patient was afebrile, his pulse was 50, he was in atrial fibrillation. Telemetry also reveals patient breaking down to as low as 45 bpm and also having frequent unifocal PVCs occasionally in a bigeminy pattern, her pressure was 143/76. Patient was found to be orthostatic with a supine blood pressure of 145/77 and a pulse of 60 with a drop in blood pressure to 126/72 with a pulse of 61 with standing. He was given 1 L  5 he fluids in the ER. Laboratory data was unremarkable except for slightly elevated glucose of 123, creatinine 1.29, calcium 10.3, urinalysis was unremarkable except for turbid appearance but no evidence of UTI. EKG revealed atrial fibrillation with ventricular response of 51 and unifocal PVC, no ischemic changes.   Echo 2010 - LV EF of 60-65%, mild bilateral atrial enlargement. Patient has no family.    Past Medical History  Diagnosis Date  . A-fib      History reviewed. No pertinent past surgical history.  No Known Allergies  I have reviewed the patient's current medications   . sodium chloride 75 mL/hr at 10/22/14 1636     Prior to Admission medications   Medication Sig Start Date End Date Taking? Authorizing Provider  amiodarone (PACERONE) 200 MG tablet TAKE ONE TABLET BY MOUTH ONCE DAILY.  Patient taking differently: TAKE ONE TABLET BY MOUTH every evening. 03/18/13  Yes Marykay Lex, MD  aspirin 81 MG tablet Take 81 mg by mouth at bedtime.    Yes Historical Provider, MD  donepezil (ARICEPT) 10 MG tablet Take 10 mg by mouth at bedtime.   Yes Historical Provider, MD  Multiple Vitamins-Minerals (MULTIVITAMIN WITH MINERALS) tablet Take 1 tablet by mouth at bedtime.    Yes Historical Provider, MD     History   Social History  . Marital Status: Divorced    Spouse Name: N/A  . Number of Children: N/A  . Years of Education: N/A   Occupational History  . Not on file.   Social History Main Topics  . Smoking  status: Never Smoker   . Smokeless tobacco: Never Used  . Alcohol Use: Yes     Comment: "very little, occasional wine with food."  . Drug Use: No  . Sexual Activity: Not on file   Other Topics Concern  . Not on file   Social History Narrative  . No narrative on file    No family status information on file.   History reviewed. No pertinent family history.   ROS:  Full 14 point review of systems complete and found to be negative unless listed  above.  Physical Exam: Blood pressure 143/81, pulse 49, temperature 97.4 F (36.3 C), temperature source Oral, resp. rate 15, height 6' (1.829 m), weight 180 lb (81.647 kg), SpO2 98 %.  General: Well developed, well nourished, male in no acute distress Head: Eyes PERRLA, No xanthomas. Normocephalic and atraumatic, oropharynx without edema or exudate.  Lungs: Resp regular and unlabored, CTA. Heart: irregular with  no s3, s4, or murmurs..   Neck: No carotid bruits. No lymphadenopathy or  JVD. Abdomen: Bowel sounds present, abdomen soft and non-tender without masses or hernias noted. Msk:  No spine or cva tenderness. No weakness, no joint deformities or effusions. Extremities: No clubbing, cyanosis or edema. DP/PT/Radials 2+ and equal bilaterally. Neuro: Alert and oriented X 3. No focal deficits noted. Psych:  Good affect, responds appropriately Skin: No rashes or lesions noted.  Labs:   Lab Results  Component Value Date   WBC 6.2 10/22/2014   HGB 14.6 10/22/2014   HCT 43.3 10/22/2014   MCV 97.5 10/22/2014   PLT 217 10/22/2014   No results for input(s): INR in the last 72 hours.  Recent Labs Lab 10/22/14 1245  NA 142  K 4.2  CL 107  CO2 27  BUN 15  CREATININE 1.29*  CALCIUM 10.3  GLUCOSE 120*   No results found for: MG  Recent Labs  10/22/14 1245  TROPONINI <0.03    ECG:  afib Vent. rate 51 BPM PR interval * ms QRS duration 87 ms QT/QTc 502/462 ms P-R-T axes -1 45 20  Radiology:  No results found.  ASSESSMENT AND PLAN:     1. Chronic atrial fibrillation - Patient reports that 2 months ago warfarin was discontinued in favor of aspirin 81 mg by PCP - CHADVASc = 2 - Stop Amiodarone - Will get TSH and echo  2. Symptomatic bradycardia -Unclear if related to primary conduction issue or secondary to medications -Aricept has been documented to cause AV block, bradycardia, syncope therefore has been discontinued at time of admission. -get records from PCP  tomorrow, If not no BB, consult EP  3 Syncope -Admit to telemetry -Suspect multifactorial etiology: Orthostasis possibly from volume depletion in setting of likely symptomatic bradycardia -Echocardiogram -Cycle troponin -Aricept can cause syncope, stopped  4. Orthostatic hypotension -Uncertain of primarily from volume depletion or for related to symptomatic bradycardia -Was given a liter of IV fluids in the ER and will continue IV fluids at least overnight -Repeat orthostatic vital signs in a.m.  5. CKD (chronic kidney disease), stage III -Baseline renal function unknown  6. Dementia -Patient still lives alone -No apparent falls at home and no unexplained bruising -PT evaluation since presented with syncope  7. History adenocarcinoma of the prostate -Patient does not recall this history and was obtained from the electronic medical record -Patient underwent radioactive iodine seed implantation in 2012  Signed: [place], [place] 10/22/2014, 4:51 PM Pager [phone]  Co-Sign MD   Attending Note:  The patient was seen and examined.  Agree with assessment and plan as noted above.  Changes made to the above note as needed.  Pt has significant bradycardia. We do not have any records and do not know his current meds. He had an episode of syncope. Will contact his medical doctor tomorrow Check TSH. If his HR does not increase, he will need a pacer   Vesta Mixer, Montez Hageman., MD, [facility] 10/22/2014, 9:35 PM [address]  [address] Office [phone] Pager [phone]

## 2014-10-23 ENCOUNTER — Inpatient Hospital Stay (HOSPITAL_COMMUNITY): Payer: Medicare Other

## 2014-10-23 DIAGNOSIS — R55 Syncope and collapse: Secondary | ICD-10-CM

## 2014-10-23 DIAGNOSIS — I951 Orthostatic hypotension: Secondary | ICD-10-CM

## 2014-10-23 DIAGNOSIS — I482 Chronic atrial fibrillation: Secondary | ICD-10-CM

## 2014-10-23 DIAGNOSIS — I4891 Unspecified atrial fibrillation: Secondary | ICD-10-CM | POA: Insufficient documentation

## 2014-10-23 DIAGNOSIS — F0390 Unspecified dementia without behavioral disturbance: Secondary | ICD-10-CM

## 2014-10-23 DIAGNOSIS — N183 Chronic kidney disease, stage 3 (moderate): Secondary | ICD-10-CM

## 2014-10-23 LAB — URINALYSIS, ROUTINE W REFLEX MICROSCOPIC
Bilirubin Urine: NEGATIVE
Glucose, UA: NEGATIVE mg/dL
Hgb urine dipstick: NEGATIVE
Ketones, ur: NEGATIVE mg/dL
Leukocytes, UA: NEGATIVE
NITRITE: NEGATIVE
PH: 6 (ref 5.0–8.0)
PROTEIN: NEGATIVE mg/dL
Specific Gravity, Urine: 1.015 (ref 1.005–1.030)
UROBILINOGEN UA: 1 mg/dL (ref 0.0–1.0)

## 2014-10-23 LAB — SODIUM, URINE, RANDOM: Sodium, Ur: 102 mmol/L

## 2014-10-23 LAB — TROPONIN I
TROPONIN I: 0.06 ng/mL — AB (ref <0.031)
Troponin I: 0.03 ng/mL (ref <0.031)

## 2014-10-23 LAB — GLUCOSE, CAPILLARY: Glucose-Capillary: 91 mg/dL (ref 65–99)

## 2014-10-23 LAB — CREATININE, URINE, RANDOM: Creatinine, Urine: 95.35 mg/dL

## 2014-10-23 MED ORDER — HYDRALAZINE HCL 20 MG/ML IJ SOLN
5.0000 mg | Freq: Four times a day (QID) | INTRAMUSCULAR | Status: DC | PRN
  Administered 2014-10-23: 5 mg via INTRAVENOUS
  Filled 2014-10-23: qty 1

## 2014-10-23 NOTE — Progress Notes (Signed)
    Subjective:  Pt says he feels fine, angry, wants to go home.   Objective:  Vital Signs in the last 24 hours: Temp:  [97.4 F (36.3 C)-98.4 F (36.9 C)] 97.9 F (36.6 C) (07/25 0545) Pulse Rate:  [42-68] 54 (07/25 0558) Resp:  [12-23] 18 (07/25 0545) BP: (98-185)/(42-108) 169/92 mmHg (07/25 0558) SpO2:  [88 %-100 %] 95 % (07/25 0545) Weight:  [166 lb 4.8 oz (75.433 kg)-180 lb (81.647 kg)] 168 lb (76.204 kg) (07/25 0545)  Intake/Output from previous day:  Intake/Output Summary (Last 24 hours) at 10/23/14 1108 Last data filed at 10/23/14 4098  Gross per 24 hour  Intake   2705 ml  Output   1776 ml  Net    929 ml    Physical Exam: General appearance: alert, cooperative and no distress Lungs: clear to auscultation bilaterally Heart: irregularly irregular rhythm Extremities: chronic venous skin changes, no edema   Rate: 32-55  Rhythm: atrial fibrillation and with bradycardia  Lab Results:  Recent Labs  10/22/14 1245  WBC 6.2  HGB 14.6  PLT 217    Recent Labs  10/22/14 1245  NA 142  K 4.2  CL 107  CO2 27  GLUCOSE 120*  BUN 15  CREATININE 1.29*    Recent Labs  10/22/14 2151 10/23/14 0420  TROPONINI 0.03 0.06*   No results for input(s): INR in the last 72 hours.  Scheduled Meds: . aspirin EC  81 mg Oral QHS  . enoxaparin (LOVENOX) injection  40 mg Subcutaneous Q24H  . multivitamin with minerals  1 tablet Oral QHS  . sodium chloride  3 mL Intravenous Q12H   Continuous Infusions:  PRN Meds:.acetaminophen **OR** acetaminophen, alum & mag hydroxide-simeth, ondansetron **OR** ondansetron (ZOFRAN) IV   Imaging: No results found.   Assessment/Plan:   Principal Problem:   Syncope Active Problems:   Symptomatic bradycardia   Chronic atrial fibrillation   CKD (chronic kidney disease), stage III   Dementia   Orthostatic hypotension   Low testosterone   H/O prostate cancer   PLAN: Pt thinks his bradycardia is from Aricept which has been  stopped as well as Amiodarone. He wants to go home but has agreed to stay for now . Echo pending. He needs a CXR. 4th Troponin was elevated 0.06- wlll check another. Saline lock IVF.   Corine Shelter PA-C 10/23/2014, 11:08 AM [phone]  I have examined the patient and reviewed assessment and plan and discussed with patient.  Agree with above as stated.  Watch for bradycardia.  He will stay tonight.  I suspect he will demand to leave today.  I spoke with him and two fellow church members at length today.  They convinced him to stay.  Will have EP see if HR drops below 40 or if he has sx of bradycardia.  I suspect he will insist on d/c tomorrow.   VARANASI,JAYADEEP S.

## 2014-10-23 NOTE — Progress Notes (Signed)
[facility] PROGRESS NOTE Assessment/Plan: Syncope: - Multifactorial due to bradycardia and orthostatic hypotension. - Orthostatic positive, started on IV fluids. - Repeat b-met. Mild elevation in troponin's.  Orthostatic hypotension: - On IV fluid, BP trending, repeat B-met recheck orthostatics.  Symptomatic bradycardia: - Cards consulted, d/c aricept. HR on telemetry as low as 39. - Obtain records from PCP.  Dementia without behavioral disturbances: - Patient still lives alone - No apparent falls at home and no unexplained bruising - PT evaluation  Chronic atrial fibrillation: - Echo pending, only on ASA per PCP. - Amio stopped. - TSH pending.  CKD (chronic kidney disease), stage III: - unknown baseline will need PCP records.  H/O prostate cancer   Code Status: full Family Communication: none  Disposition Plan: observation   Consultants:  cardiology  Procedures:  Echo pending  Antibiotics:  None  HPI/Subjective: No further episodes of syncope.  Objective: Filed Vitals:   10/23/14 0545 10/23/14 0550 10/23/14 0555 10/23/14 0558  BP: 152/78 172/108 185/85 169/92  Pulse: 50 59 62 54  Temp: 97.9 F (36.6 C)     TempSrc: Oral     Resp: 18     Height:      Weight: 76.204 kg (168 lb)     SpO2: 95%       Intake/Output Summary (Last 24 hours) at 10/23/14 0813 Last data filed at 10/23/14 0600  Gross per 24 hour  Intake   2345 ml  Output   1476 ml  Net    869 ml   Filed Weights   10/22/14 1215 10/22/14 1752 10/23/14 0545  Weight: 81.647 kg (180 lb) 75.433 kg (166 lb 4.8 oz) 76.204 kg (168 lb)    Exam:  General: Alert, awake, oriented x3, in no acute distress.  HEENT: No bruits, no goiter.  Heart: Regular rate and rhythm. Lungs: Good air movement, clear Abdomen: Soft, nontender, nondistended, positive bowel sounds.  Neuro: Grossly intact, nonfocal.   Data Reviewed: Basic Metabolic Panel:  Recent Labs Lab 10/22/14 1245  NA  142  K 4.2  CL 107  CO2 27  GLUCOSE 120*  BUN 15  CREATININE 1.29*  CALCIUM 10.3   Liver Function Tests: No results for input(s): AST, ALT, ALKPHOS, BILITOT, PROT, ALBUMIN in the last 168 hours. No results for input(s): LIPASE, AMYLASE in the last 168 hours. No results for input(s): AMMONIA in the last 168 hours. CBC:  Recent Labs Lab 10/22/14 1245  WBC 6.2  HGB 14.6  HCT 43.3  MCV 97.5  PLT 217   Cardiac Enzymes:  Recent Labs Lab 10/22/14 1245 10/22/14 1721 10/22/14 2151 10/23/14 0420  TROPONINI <0.03 <0.03 0.03 0.06*   BNP (last 3 results) No results for input(s): BNP in the last 8760 hours.  ProBNP (last 3 results) No results for input(s): PROBNP in the last 8760 hours.  CBG:  Recent Labs Lab 10/22/14 1316 10/23/14 0553  GLUCAP 123* 91    No results found for this or any previous visit (from the past 240 hour(s)).   Studies: No results found.  Scheduled Meds: . aspirin EC  81 mg Oral QHS  . enoxaparin (LOVENOX) injection  40 mg Subcutaneous Q24H  . multivitamin with minerals  1 tablet Oral QHS  . sodium chloride  3 mL Intravenous Q12H   Continuous Infusions: . sodium chloride 75 mL/hr at 10/23/14 0014    Time Spent: 25 min   Charlynne Cousins  [facility] Pager [phone]. If 7PM-7AM, please contact night-coverage at  [email or web address], password [facility] 10/23/2014, 8:13 AM  LOS: 1 day

## 2014-10-23 NOTE — Progress Notes (Signed)
Utilization review complete. Kristin Lamagna RN CCM Case Mgmt phone [phone] 

## 2014-10-23 NOTE — Progress Notes (Signed)
PT Cancellation Note  Patient Details Name: Craig Bennett MRN: [id number] DOB: 07/17/1929   Cancelled Treatment:    Reason Eval/Treat Not Completed: Medical issues which prohibited therapy Pt with elevated troponin today. Will hold PT evaluation until cleared by cardiology. Will follow up next available time.   [name] 10/23/2014, 9:51 AM Mylo Red, PT, DPT [phone]

## 2014-10-24 ENCOUNTER — Encounter (HOSPITAL_COMMUNITY): Payer: Self-pay | Admitting: Nurse Practitioner

## 2014-10-24 ENCOUNTER — Inpatient Hospital Stay (HOSPITAL_COMMUNITY): Payer: Medicare Other

## 2014-10-24 DIAGNOSIS — N179 Acute kidney failure, unspecified: Secondary | ICD-10-CM

## 2014-10-24 DIAGNOSIS — R55 Syncope and collapse: Secondary | ICD-10-CM

## 2014-10-24 DIAGNOSIS — R001 Bradycardia, unspecified: Principal | ICD-10-CM

## 2014-10-24 LAB — BASIC METABOLIC PANEL
ANION GAP: 8 (ref 5–15)
BUN: 13 mg/dL (ref 6–20)
CO2: 25 mmol/L (ref 22–32)
Calcium: 9.5 mg/dL (ref 8.9–10.3)
Chloride: 103 mmol/L (ref 101–111)
Creatinine, Ser: 1.06 mg/dL (ref 0.61–1.24)
GFR calc Af Amer: >60 mL/min (ref >60)
Glucose, Bld: 82 mg/dL (ref 65–99)
Potassium: 3.8 mmol/L (ref 3.5–5.1)
Sodium: 136 mmol/L (ref 135–145)

## 2014-10-24 LAB — GLUCOSE, CAPILLARY: Glucose-Capillary: 86 mg/dL (ref 65–99)

## 2014-10-24 NOTE — Progress Notes (Signed)
[facility] PROGRESS NOTE Assessment/Plan: Syncope: - Multifactorial due to bradycardia and orthostatic hypotension. - Orthostatics resolved.  AKI: - likely pre-renal. - resolved with IV hydration.  Orthostatic hypotension: - Resolved with IV hydration.  Symptomatic bradycardia: - Cards consulted, d/c Aricept and amio. HR on telemetry as low as 39. - EP consult pending.  Dementia without behavioral disturbances: - Patient still lives alone - No apparent falls at home and no unexplained bruising - PT evaluation  Chronic atrial fibrillation: - Echo pending, only on ASA per PCP. - Amio stopped. - TSH 1.7  H/O prostate cancer   Code Status: full Family Communication: none  Disposition Plan: observation   Consultants:  cardiology  Procedures:  Echo pending  Antibiotics:  None  HPI/Subjective: No further episodes of syncope. Wants to go home.  Objective: Filed Vitals:   10/23/14 2246 10/24/14 0220 10/24/14 0448 10/24/14 0707  BP: 192/93 127/72 156/94 154/74  Pulse: 42 55 62 47  Temp:  98.2 F (36.8 C) 97.9 F (36.6 C)   TempSrc:  Oral Oral   Resp:  18 18   Height:      Weight:   75.342 kg (166 lb 1.6 oz)   SpO2:  94% 96%     Intake/Output Summary (Last 24 hours) at 10/24/14 0815 Last data filed at 10/24/14 0448  Gross per 24 hour  Intake    950 ml  Output   2135 ml  Net  -1185 ml   Filed Weights   10/22/14 1752 10/23/14 0545 10/24/14 0448  Weight: 75.433 kg (166 lb 4.8 oz) 76.204 kg (168 lb) 75.342 kg (166 lb 1.6 oz)    Exam:  General: Alert, awake, oriented x3, in no acute distress.  HEENT: No bruits, no goiter.  Heart: Regular rate and rhythm. Lungs: Good air movement, clear Abdomen: Soft, nontender, nondistended, positive bowel sounds.  Neuro: Grossly intact, nonfocal.   Data Reviewed: Basic Metabolic Panel:  Recent Labs Lab 10/22/14 1245 10/24/14 0338  NA 142 136  K 4.2 3.8  CL 107 103  CO2 27 25  GLUCOSE 120*  82  BUN 15 13  CREATININE 1.29* 1.06  CALCIUM 10.3 9.5   Liver Function Tests: No results for input(s): AST, ALT, ALKPHOS, BILITOT, PROT, ALBUMIN in the last 168 hours. No results for input(s): LIPASE, AMYLASE in the last 168 hours. No results for input(s): AMMONIA in the last 168 hours. CBC:  Recent Labs Lab 10/22/14 1245  WBC 6.2  HGB 14.6  HCT 43.3  MCV 97.5  PLT 217   Cardiac Enzymes:  Recent Labs Lab 10/22/14 1245 10/22/14 1721 10/22/14 2151 10/23/14 0420 10/23/14 1321  TROPONINI <0.03 <0.03 0.03 0.06* 0.03   BNP (last 3 results) No results for input(s): BNP in the last 8760 hours.  ProBNP (last 3 results) No results for input(s): PROBNP in the last 8760 hours.  CBG:  Recent Labs Lab 10/22/14 1316 10/23/14 0553  GLUCAP 123* 91    No results found for this or any previous visit (from the past 240 hour(s)).   Studies: Dg Chest 1 View  10/23/2014   CLINICAL DATA:  Syncope.  EXAM: CHEST  1 VIEW  COMPARISON:  None.  FINDINGS: The heart size and mediastinal contours are within normal limits. Both lungs are clear. No pneumothorax or pleural effusion is noted. The visualized skeletal structures are unremarkable.  IMPRESSION: No acute cardiopulmonary abnormality seen.   Electronically Signed   By: Lupita Raider, M.D.   On: 10/23/2014  12:31    Scheduled Meds: . aspirin EC  81 mg Oral QHS  . enoxaparin (LOVENOX) injection  40 mg Subcutaneous Q24H  . multivitamin with minerals  1 tablet Oral QHS  . sodium chloride  3 mL Intravenous Q12H   Continuous Infusions:    Time Spent: 25 min   Marinda Elk  [facility] Pager [phone]. If 7PM-7AM, please contact night-coverage at [email or web address], password [facility] 10/24/2014, 8:15 AM  LOS: 2 days

## 2014-10-24 NOTE — Progress Notes (Addendum)
Patient Profile: 81 year old male patient with known chronic atrial fibrillation previously on amiodarone and dementia on Aricept, admitted for syncope in the setting of bradycardia with pulse rate in the 40s and orthostatic hypotension.   Subjective: No complaints. Feels much better today. Denies any recurrent syncope/ near syncope. No CP or dyspnea.   Objective: Vital signs in last 24 hours: Temp:  [97.9 F (36.6 C)-98.2 F (36.8 C)] 97.9 F (36.6 C) (07/26 0448) Pulse Rate:  [42-62] 47 (07/26 0707) Resp:  [18] 18 (07/26 0448) BP: (127-192)/(72-95) 154/74 mmHg (07/26 0707) SpO2:  [94 %-100 %] 96 % (07/26 0448) Weight:  [166 lb 1.6 oz (75.342 kg)] 166 lb 1.6 oz (75.342 kg) (07/26 0448) Last BM Date: 10/23/14  Intake/Output from previous day: 07/25 0701 - 07/26 0700 In: 950 [P.O.:940; I.V.:10] Out: 2135 [Urine:2135] Intake/Output this shift: Total I/O In: 240 [P.O.:240] Out: -   Medications Current Facility-Administered Medications  Medication Dose Route Frequency Provider Last Rate Last Dose  . acetaminophen (TYLENOL) tablet 650 mg  650 mg Oral Q6H PRN Russella Dar, NP       Or  . acetaminophen (TYLENOL) suppository 650 mg  650 mg Rectal Q6H PRN Russella Dar, NP      . alum & mag hydroxide-simeth (MAALOX/MYLANTA) 200-200-20 MG/5ML suspension 30 mL  30 mL Oral Q6H PRN Russella Dar, NP      . aspirin EC tablet 81 mg  81 mg Oral QHS Russella Dar, NP   81 mg at 10/23/14 2300  . enoxaparin (LOVENOX) injection 40 mg  40 mg Subcutaneous Q24H Russella Dar, NP   40 mg at 10/23/14 0002  . hydrALAZINE (APRESOLINE) injection 5 mg  5 mg Intravenous Q6H PRN Leda Gauze, NP   5 mg at 10/23/14 2349  . multivitamin with minerals tablet 1 tablet  1 tablet Oral QHS Russella Dar, NP   1 tablet at 10/23/14 2300  . ondansetron (ZOFRAN) tablet 4 mg  4 mg Oral Q6H PRN Russella Dar, NP       Or  . ondansetron [facility]) injection 4 mg  4 mg Intravenous Q6H PRN  Russella Dar, NP      . sodium chloride 0.9 % injection 3 mL  3 mL Intravenous Q12H Russella Dar, NP   3 mL at 10/23/14 2300    PE: General appearance: alert, cooperative and no distress Neck: no carotid bruit and no JVD Lungs: clear to auscultation bilaterally Heart: irregularly irregular rhythm and bradycardia Extremities: no LEE Pulses: 2+ and symmetric Skin: warm and dry Neurologic: Grossly normal  Lab Results:   Recent Labs  10/22/14 1245  WBC 6.2  HGB 14.6  HCT 43.3  PLT 217   BMET  Recent Labs  10/22/14 1245 10/24/14 0338  NA 142 136  K 4.2 3.8  CL 107 103  CO2 27 25  GLUCOSE 120* 82  BUN 15 13  CREATININE 1.29* 1.06  CALCIUM 10.3 9.5   Cardiac Panel (last 3 results)  Recent Labs  10/22/14 2151 10/23/14 0420 10/23/14 1321  TROPONINI 0.03 0.06* 0.03    Studies/Results: 2D echo- pending   Assessment/Plan  Principal Problem:   Syncope Active Problems:   Dementia   Chronic atrial fibrillation   Orthostatic hypotension   Symptomatic bradycardia   Low testosterone   CKD (chronic kidney disease), stage III   H/O prostate cancer   Atrial fibrillation, unspecified   Syncope and collapse   AKI (  acute kidney injury)   1. Bradycardia: HR improved and now in the upper 50s-60s but he had a couple episodes of recurrent bradycardia earlier this am with rates in the mid 30s. He has remained asymptomatic since admission w/o further syncope/ near syncope. Only a slight bump in his 2nd troponin to 0.06. His other 2 troponins were normal. No h/o chest pain. 2D echo pending. TSH WNL. K is stable. He may benefit from further cardiac monitoring with an outpatient 30 day event monitor. Avoid AVN blocking agents. Continue to avoid Aricept if possible given potential risk to induce bradycardia.    LOS: 2 days    Brittainy M. Sharol Harness, PA-C 10/24/2014 11:22 AM  I have examined the patient and reviewed assessment and plan and discussed with patient.  Agree  with above as stated.  Despite being off Aricept for 48 hours, HR still dropping in to the 35 range at times while presumably awake.  Will refer to EP to consider pacer.  Cr now stable.   Trey Gulbranson S.

## 2014-10-24 NOTE — Plan of Care (Signed)
Problem: Phase I Progression Outcomes Goal: Anticoagulation Therapy per MD order Outcome: Not Progressing Pt refuses lovenox sq despite explanation and education on importance of drug.

## 2014-10-24 NOTE — Progress Notes (Signed)
Elevated bp at 190's systolic, provider notified.  Orders for hydralazine  IV PRN ordered, given as needed.  Will continue to monitor and evaluate.

## 2014-10-24 NOTE — Progress Notes (Signed)
Echocardiogram 2D Echocardiogram has been performed.  Nolon Rod 10/24/2014, 4:13 PM

## 2014-10-24 NOTE — Care Management Important Message (Signed)
Important Message  Patient Details  Name: Craig Bennett MRN: [id number] Date of Birth: 1929-06-26   Medicare Important Message Given:  Yes-second notification given    Kyla Balzarine 10/24/2014, 1:21 PMImportant Message  Patient Details  Name: Craig Bennett MRN: [id number] Date of Birth: 1929/10/30   Medicare Important Message Given:  Yes-second notification given    Kyla Balzarine 10/24/2014, 1:21 PM

## 2014-10-24 NOTE — Evaluation (Signed)
Physical Therapy Evaluation Patient Details Name: Craig Bennett MRN: [id number] DOB: 12-Jul-1929 Today's Date: 10/24/2014   History of Present Illness  Patient is a 81 y/o male admitted due to syncope at church. In ED, telemetry revealed HR as low as 45 bpm w/ frequent unifocal PVCs occasionally in a bigeminy pattern. Found to have orthostatic hypotension. PMH of chronic A-fib and dementia. Workup pending.  Clinical Impression  Patient presents with generalized weakness, balance deficits and bradycardia impacting safe mobility. Attempted gait training with RW however pt reluctant to use it. Unsteady on feet requiring MIn A for safety. Pt lives alone in independent living facility and walks to grocery store and church which is 1 block away. Concerned about pt going home alone at this time due to poor safety awareness and pt being falls risk. Pt declining any rehab. If progress does not improve, pt will need ST SNF to maximize independence and mobility and minimize fall risk prior to return home. Will follow acutely. Encouraged ambulation daily with RN.    Follow Up Recommendations Home health PT;Supervision/Assistance - 24 hour (would benefit from transfer to ALF.)    Equipment Recommendations  Other (comment) (TBD.)    Recommendations for Other Services OT consult     Precautions / Restrictions Precautions Precautions: Fall Precaution Comments: bradycardia Restrictions Weight Bearing Restrictions: No      Mobility  Bed Mobility Overal bed mobility: Needs Assistance Bed Mobility: Supine to Sit;Sit to Supine     Supine to sit: Modified independent (Device/Increase time);HOB elevated Sit to supine: HOB elevated;Modified independent (Device/Increase time)   General bed mobility comments: No physical assist needed.  Transfers Overall transfer level: Needs assistance Equipment used: None Transfers: Sit to/from Stand Sit to Stand: Min guard         General transfer comment: Min  guard due to unsteadiness upon standing. Reaching for sink counter for support.   Ambulation/Gait Ambulation/Gait assistance: Min assist Ambulation Distance (Feet): 125 Feet Assistive device: Rolling walker (2 wheeled);None Gait Pattern/deviations: Step-through pattern;Decreased stride length;Staggering left;Staggering right;Narrow base of support   Gait velocity interpretation: <1.8 ft/sec, indicative of risk for recurrent falls General Gait Details: Pt placing arm around therapist's waist during ambulation for support and using rail for other UE support. Attempted ambulation with RW however pt will need training and practice in this, however reluctant to use. HR ranged from 59-71 bpm/  Stairs            Wheelchair Mobility    Modified Rankin (Stroke Patients Only)       Balance Overall balance assessment: Needs assistance Sitting-balance support: Feet supported;No upper extremity supported Sitting balance-Leahy Scale: Good     Standing balance support: During functional activity Standing balance-Leahy Scale: Fair Standing balance comment: Furniture walking within room and hallway and requires external support during mobility.                             Pertinent Vitals/Pain Pain Assessment: No/denies pain    Home Living Family/patient expects to be discharged to:: Private residence Living Arrangements: Alone   Type of Home: Independent living facility [facility] Home Access: Level entry     Home Layout: One level Home Equipment: None      Prior Function Level of Independence: Independent         Comments: Walks to church and grocery store which is ~1 block away. No falls reported.      Hand Dominance  Extremity/Trunk Assessment   Upper Extremity Assessment: Defer to OT evaluation           Lower Extremity Assessment: Generalized weakness         Communication   Communication: No difficulties  Cognition  Arousal/Alertness: Awake/alert Behavior During Therapy: WFL for tasks assessed/performed Overall Cognitive Status: Impaired/Different from baseline Area of Impairment: Safety/judgement;Problem solving         Safety/Judgement: Decreased awareness of safety;Decreased awareness of deficits   Problem Solving: Slow processing      General Comments      Exercises        Assessment/Plan    PT Assessment Patient needs continued PT services  PT Diagnosis Difficulty walking;Generalized weakness   PT Problem List Decreased strength;Decreased cognition;Decreased balance;Decreased mobility;Decreased safety awareness  PT Treatment Interventions Balance training;Gait training;Therapeutic activities;Therapeutic exercise;Functional mobility training;Patient/family education;DME instruction   PT Goals (Current goals can be found in the Care Plan section) Acute Rehab PT Goals Patient Stated Goal: to go home yesterday PT Goal Formulation: With patient Time For Goal Achievement: 11/07/14 Potential to Achieve Goals: Good    Frequency Min 3X/week   Barriers to discharge Decreased caregiver support Lives alone    Co-evaluation               End of Session Equipment Utilized During Treatment: Gait belt Activity Tolerance: Patient tolerated treatment well Patient left: in bed;with bed alarm set;with call bell/phone within reach Nurse Communication: Mobility status         Time: 1610-9604 PT Time Calculation (min) (ACUTE ONLY): 17 min   Charges:   PT Evaluation $Initial PT Evaluation Tier I: 1 Procedure     PT G Codes:        Tashiba Timoney A Letha Mirabal 10/24/2014, 9:52 AM Mylo Red, PT, DPT [phone]

## 2014-10-24 NOTE — Consult Note (Signed)
CARDIOLOGY CONSULT NOTE   Patient ID: Craig Bennett MRN: [id number], DOB/AGE: [birth year]   Admit date: 10/22/2014 Date of Consult: 10/24/2014   Primary Physician: Thayer Headings, MD Primary Cardiologist: Not seen recently.  Pt says that he used to see a Dr. Shon Baton but per PCP note - he was seeing Dr. Allyson Sabal.  We do not have any notes from Dr. Allyson Sabal in Epic on this pt (?old [facility] records).  Pt. Profile  81 y/o male with a h/o Afib who was admitted on 7/24 secondary to syncope that occurred after singing @ church.  Problem List  Past Medical History  Diagnosis Date  . A-fib     a. CHA2DS2VASc = 2, prev on coumadin but d/c'd ~ 07/2014 - told he was in sinus rhythm;  b. 09/2014 amio d/c'd in setting of persistent afib and bradycardia  . Syncope     a. 09/2014  . Dementia     a. 09/2014 Aricept d/c'd in setting of bradycardia.  . Low testosterone   . History of echocardiogram     a. 11/2008 Echo: EF 60-65%, no rwma, mild LVH, triv AI, mild MR, mildly dil LA/RA.  . CKD (chronic kidney disease), stage II   . Chronic diastolic CHF (congestive heart failure)     History reviewed. No pertinent past surgical history.   Allergies  No Known Allergies  HPI   81 y/o male with a h/o afib.  He had previously been managed by a cardiologist (? Dr. Debbe Bales) but has not been seen by cardiology since his former cardiologist retired several years ago.  He was managed with amio and coumadin but he says that about 2 mos ago, his PCP told him to stop coumadin b/c he was apparently in sinus rhythm.  He is not clear on the details.  I've just received his PCP records and note from June indicates that he was in rate controlled afib and that the pt refused anticoagulation.  On 7/24, he walked about a 1/2 mile to church.  He says it was already in the low 80's at that point and he was more fatigued than he might usually expect him to be after that walk.  Once there, he rehearsed a song with the choir prior to the  beginning of the service.  By the end of the song, he felt very fatigued and felt as though he needed to sit down.  He denies feeling lightheaded, dizzy, dyspneic, or experiencing chest pain, just that he felt very tired.  Once the song finished, he sat and continued to feel tired.  After sitting for about a minute or two (he estimates), he apparently lost consciousness and fell onto the chair on his left.  He regained consciousness but isn't sure how long he was w/o consciousness.  Other church goers were shaking him when he came to.  Upon regaining consciousness, he says that he was asymptomatic and simply wanted to sit up.  EMS was called and he was taken to the [facility] ED.  There, he was in afib in the 40's to 50. He was orthostatic, dropping his BP from 145/77 lying to 126/72 standing (HR's 60 and 61 respectively).  Labs were otw unrevealing.  He was admitted and since, has remained in afib in the 40's to 50's with pauses of up to 2.5 seconds but no prolonged pauses or recurrence of presyncope or syncope.  He denies chest pain, palpitations, dyspnea, pnd, orthopnea, n, v, dizziness, syncope, edema, weight gain,  or early satiety.  He feels that on that particular day, it was just hotter than usual during his walk and that led to his Ss.  Inpatient Medications  . aspirin EC  81 mg Oral QHS  . enoxaparin (LOVENOX) injection  40 mg Subcutaneous Q24H  . multivitamin with minerals  1 tablet Oral QHS  . sodium chloride  3 mL Intravenous Q12H   Family History Family History  Problem Relation Age of Onset  . Other      no premature CAD.     Social History History   Social History  . Marital Status: Divorced    Spouse Name: N/A  . Number of Children: N/A  . Years of Education: N/A   Occupational History  . Not on file.   Social History Main Topics  . Smoking status: Never Smoker   . Smokeless tobacco: Never Used  . Alcohol Use: Yes     Comment: "very little, occasional wine with food."  .  Drug Use: No  . Sexual Activity: Not on file   Other Topics Concern  . Not on file   Social History Narrative   Lives in [place] by himself - [facility].  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  Prefers to be called Dr. Viviana Simpler.  Does not routinely exercise.  Active in his church choir.     Review of Systems  General:  No chills, fever, night sweats or weight changes.  Cardiovascular:  +++syncope and fatigue on 7/24.  No chest pain, dyspnea on exertion, edema, orthopnea, palpitations, paroxysmal nocturnal dyspnea. Dermatological: No rash, lesions/masses Respiratory: No cough, dyspnea Urologic: No hematuria, dysuria Abdominal:   No nausea, vomiting, diarrhea, bright red blood per rectum, melena, or hematemesis Neurologic:  No visual changes, wkns, changes in mental status. All other systems reviewed and are otherwise negative except as noted above.  Physical Exam  Blood pressure 138/79, pulse 47, temperature 97.9 F (36.6 C), temperature source Oral, resp. rate 16, height 6' (1.829 m), weight 166 lb 1.6 oz (75.342 kg), SpO2 96 %.  General: Pleasant, NAD Psych: Normal affect. Neuro: Alert and oriented X 3. Moves all extremities spontaneously. HEENT: Normal  Neck: Supple without bruits or JVD. Lungs:  Resp regular and unlabored, CTA. Heart: RRR no s3, s4, or murmurs. Abdomen: Soft, non-tender, non-distended, BS + x 4.  Extremities: No clubbing, cyanosis or edema. DP/PT/Radials 2+ and equal bilaterally.  Labs   Recent Labs  10/22/14 1721 10/22/14 2151 10/23/14 0420 10/23/14 1321  TROPONINI <0.03 0.03 0.06* 0.03   Lab Results  Component Value Date   WBC 6.2 10/22/2014   HGB 14.6 10/22/2014   HCT 43.3 10/22/2014   MCV 97.5 10/22/2014   PLT 217 10/22/2014     Recent Labs Lab 10/24/14 0338  NA 136  K 3.8  CL 103  CO2 25  BUN 13  CREATININE 1.06  CALCIUM 9.5  GLUCOSE 82   Radiology/Studies  Dg Chest 1 View  10/23/2014   CLINICAL DATA:  Syncope.  EXAM: CHEST  1 VIEW   COMPARISON:  None.  FINDINGS: The heart size and mediastinal contours are within normal limits. Both lungs are clear. No pneumothorax or pleural effusion is noted. The visualized skeletal structures are unremarkable.  IMPRESSION: No acute cardiopulmonary abnormality seen.   Electronically Signed   By: Lupita Raider, M.D.   On: 10/23/2014 12:31   ECG  Afib, 51, pvc, no acute st/t changes.  ASSESSMENT AND PLAN  1.  Syncope/bradycardia:  Pt presented  on 7/24 following a sudden syncopal episode that occurred while sitting at church.  His only prodrome was fatigue and he denies experiencing lightheadedness/presyncope just prior to losing consciousness.  He has been relatively bradycardic since admission, remaining in afib, with rates in the 40's to 60's.  He has had pauses of up to ~ 2.5 seconds but no symptomatic pauses or recurrent syncope.  He had been on amiodarone, however this was d/c'd in the setting of bradycardia and recurrent afib.  Aricept has also been d/c'd as it may contribute to bradycardia.  He was orthostatic in the ER and was treated with a 1L bolus.  He has since been hemodynamically stable.  Ambulate to assess chronotropic competence.  Echo pending.  In the absence of recurrent symptoms or prolonged pauses, plan outpt event monitoring to assess for arrhythmia while amio/aricept wash out.  2.  Persistent atrial fibrillation: With slow ventricular response.  He is asymptomatic.  He has a h/o afib and had been managed with amio.  This is on hold in setting of above.  CHA2DS2VASc = 2.  Per most recent PCP note, pt refused anticoagulation.  3.  Dementia:  Aricept d/c'd.  4. CKD II:  Stable.   Signed, Nicolasa Ducking, NP 10/24/2014, 4:21 PM   I have seen, examined the patient, and reviewed the above assessment and plan.  On exam, bradycardiac irregular rhythm.  Changes to above are made where necessary.   I am suspicous for symptomatic bradycardia as the cause.  Given his advanced  age and comorbidities, a conservative approach is advised.  Agree with stopping amiodarone and aricept. 30 day event monitor at discharge. Possibly home later today or tomorrow pending echo result.  Follow-up with EP NP in 6 weeks.  Electrophysiology team to see as needed while here. Please call with questions.   Co Sign: Hillis Range, MD 10/24/2014 4:39 PM

## 2014-10-25 ENCOUNTER — Other Ambulatory Visit: Payer: Self-pay | Admitting: Nurse Practitioner

## 2014-10-25 DIAGNOSIS — R55 Syncope and collapse: Secondary | ICD-10-CM

## 2014-10-25 LAB — BASIC METABOLIC PANEL
Anion gap: 6 (ref 5–15)
BUN: 15 mg/dL (ref 6–20)
CALCIUM: 9.7 mg/dL (ref 8.9–10.3)
CO2: 28 mmol/L (ref 22–32)
Chloride: 104 mmol/L (ref 101–111)
Creatinine, Ser: 1.08 mg/dL (ref 0.61–1.24)
GFR calc Af Amer: >60 mL/min (ref >60)
GFR calc non Af Amer: >60 mL/min (ref >60)
Glucose, Bld: 80 mg/dL (ref 65–99)
Potassium: 3.9 mmol/L (ref 3.5–5.1)
SODIUM: 138 mmol/L (ref 135–145)

## 2014-10-25 LAB — URINE CULTURE

## 2014-10-25 LAB — GLUCOSE, CAPILLARY: Glucose-Capillary: 84 mg/dL (ref 65–99)

## 2014-10-25 MED ORDER — MEMANTINE HCL 5 MG PO TABS: 5.0000 mg | ORAL_TABLET | Freq: Every day | ORAL | Status: DC

## 2014-10-25 MED ORDER — MEMANTINE HCL 5 MG PO TABS
5.0000 mg | ORAL_TABLET | Freq: Every day | ORAL | Status: DC
  Administered 2014-10-25: 5 mg via ORAL
  Filled 2014-10-25: qty 1

## 2014-10-25 NOTE — Discharge Summary (Signed)
Physician Discharge Summary  Craig Bennett JXB:[id number] DOB: 1929/07/02 DOA: 10/22/2014  PCP: No primary care provider on file.  Admit date: 10/22/2014 Discharge date: 10/25/2014  Time spent: 35 minutes  Recommendations for Outpatient Follow-up:  1. Follow-up with electrophysiology as an outpatient. 2. Home with a Holter monitor. 3. He will be off Aricept and amiodarone. 4. No driving  until cleared by cardiology.  Discharge Diagnoses:  Principal Problem:   Syncope Active Problems:   Dementia   Chronic atrial fibrillation   Orthostatic hypotension   Symptomatic bradycardia   Low testosterone   CKD (chronic kidney disease), stage III   H/O prostate cancer   Atrial fibrillation, unspecified   Syncope and collapse   AKI (acute kidney injury)   Discharge Condition: Stable  Diet recommendation: Heart healthy  Filed Weights   10/23/14 0545 10/24/14 0448 10/25/14 0708  Weight: 76.204 kg (168 lb) 75.342 kg (166 lb 1.6 oz) 74.844 kg (165 lb)    History of present illness:  81 year old male patient with known chronic atrial fibrillation previously on amiodarone, dementia on Aricept, and self report of recent diagnosis of low testosterone. The patient apparently was at church singing in the choir and felt lightheaded and dizzy but continued to sing when he suddenly collapsed. Fortunately the person next to him caught him and he did not sustain any traumatic injuries. Patient was not sure how long he was "out". He did have awareness of people around him doing things and didn't really come to a telemetry was placed in the ambulance. He denied any prior episodes of syncope. No issues of dizziness or lightheadedness prior to today. No chest pain or shortness of breath. No tachypalpitations. No increasing fatigue or dyspnea on exertion. Patient tells me about 2 months ago his warfarin was discontinued because he was apparently maintaining sinus rhythm and he was switched to aspirin 81 mg.  Unclear if primary care physician or cardiologist discontinued this medication  Hospital Course:  Syncope: - Multifactorial due to bradycardia and orthostatic hypotension. - Orthostatics resolved.  Symptomatic bradycardia: - amiodarone and Aricept were stopped cardiology was consulted. - After being off Aricept 48 hours his heart rate continued to drop into the 40s. - EP consult recommended conservative management and recommended a 30 day Holter monitor.  AKI: - likely pre-renal. - resolved with IV hydration.  Orthostatic hypotension: - Resolved with IV hydration.  Dementia without behavioral disturbances: - Patient still lives alone. - Aricept was stopped, and he was started on Namenda.  Chronic atrial fibrillation: - Echo as below,only on ASA per PCP. - Amio stopped.continues to be in sinus bradycardia asymptomatic.  - TSH 1.7  H/O prostate cancer   Procedures:  Echo 10/24/2014: Systolic function was normal. The estimated ejection fraction was in the range of 50% to 55%. Wall motion was normal; there were no regional wall motion abnormalities  Consultations:  none  Discharge Exam: Filed Vitals:   10/25/14 0708  BP: 138/55  Pulse: 50  Temp: 98 F (36.7 C)  Resp:     General: A&O x3 Cardiovascular: RRR Respiratory: good air movement CTA B/L  Discharge Instructions   Discharge Instructions    Diet - low sodium heart healthy    Complete by:  As directed      Increase activity slowly    Complete by:  As directed           Current Discharge Medication List    START taking these medications   Details  memantine (NAMENDA)  5 MG tablet Take 1 tablet (5 mg total) by mouth daily. Qty: 30 tablet, Refills: 0      CONTINUE these medications which have NOT CHANGED   Details  aspirin 81 MG tablet Take 81 mg by mouth at bedtime.     Multiple Vitamins-Minerals (MULTIVITAMIN WITH MINERALS) tablet Take 1 tablet by mouth at bedtime.       STOP taking  these medications     amiodarone (PACERONE) 200 MG tablet      donepezil (ARICEPT) 10 MG tablet        No Known Allergies    The results of significant diagnostics from this hospitalization (including imaging, microbiology, ancillary and laboratory) are listed below for reference.    Significant Diagnostic Studies: Dg Chest 1 View  10/23/2014   CLINICAL DATA:  Syncope.  EXAM: CHEST  1 VIEW  COMPARISON:  None.  FINDINGS: The heart size and mediastinal contours are within normal limits. Both lungs are clear. No pneumothorax or pleural effusion is noted. The visualized skeletal structures are unremarkable.  IMPRESSION: No acute cardiopulmonary abnormality seen.   Electronically Signed   By: Lupita Raider, M.D.   On: 10/23/2014 12:31    Microbiology: Recent Results (from the past 240 hour(s))  Urine culture     Status: None (Preliminary result)   Collection Time: 10/23/14  6:29 PM  Result Value Ref Range Status   Specimen Description URINE, RANDOM  Final   Special Requests NONE  Final   Culture TOO YOUNG TO READ  Final   Report Status PENDING  Incomplete     Labs: Basic Metabolic Panel:  Recent Labs Lab 10/22/14 1245 10/24/14 0338 10/25/14 0332  NA 142 136 138  K 4.2 3.8 3.9  CL 107 103 104  CO2 GLUCOSE 120* 82 80  BUN CREATININE 1.29* 1.06 1.08  CALCIUM 10.3 9.5 9.7   Liver Function Tests: No results for input(s): AST, ALT, ALKPHOS, BILITOT, PROT, ALBUMIN in the last 168 hours. No results for input(s): LIPASE, AMYLASE in the last 168 hours. No results for input(s): AMMONIA in the last 168 hours. CBC:  Recent Labs Lab 10/22/14 1245  WBC 6.2  HGB 14.6  HCT 43.3  MCV 97.5  PLT 217   Cardiac Enzymes:  Recent Labs Lab 10/22/14 1245 10/22/14 1721 10/22/14 2151 10/23/14 0420 10/23/14 1321  TROPONINI <0.03 <0.03 0.03 0.06* 0.03   BNP: BNP (last 3 results) No results for input(s): BNP in the last 8760 hours.  ProBNP (last 3  results) No results for input(s): PROBNP in the last 8760 hours.  CBG:  Recent Labs Lab 10/22/14 1316 10/23/14 0553 10/24/14 0705 10/25/14 0754  GLUCAP 123* 91 86 84     Signed:  FELIZ ORTIZ, ABRAHAM  [facility] 10/25/2014, 8:01 AM

## 2014-10-25 NOTE — Consult Note (Signed)
   [facility] CM Inpatient Consult   10/25/2014  Craig Bennett [birth year] [id number] Referral received from inpatient progression team.  Patient lives alone at an [place],.  His daughter lives in [place] and his nearest relative lives in [place], [place].  Patient evaluated for community based chronic disease management services with [facility] as a benefit of patient's [employer]. Met with patient at bedside to explain [facility] services.  Patient states that he has church friends and a neighbor that has been able to help him out.  He endorses that his primary care provider is Dr. Thressa Sheller.  He endorses that his nearest relative is his brother and he resides in [place], [place]. Explained the services the patient is eligible for through [facility].  Patient states that he will be started on new medication and he was told that he likely had syncope due to medications and weather.  Patient endorses that he would like some post hospital follow up especially with his new medications and minimal support. Patient will receive post discharge transition of care call and will be evaluated for monthly home visits for assessments and disease process education. Consent form signed.  Left contact information and [facility] literature at bedside. Made Inpatient Case Manager aware that [facility] following. Of note, [facility] Care Management services does not replace or interfere with any services that are arranged by inpatient case management or social work.  For additional questions or referrals please contact:   Natividad Brood, RN BSN [facility] Liaison  [phone] business mobile phone

## 2014-10-25 NOTE — Progress Notes (Signed)
Pt. Discharged home. Instructions given questions answered. IV and Tele removed. Pt belongings given to patient. Wheeled out via wheelchair.

## 2014-10-25 NOTE — Care Management Note (Signed)
Case Management Note  Patient Details  Name: Craig Bennett MRN: [id number] Date of Birth: 1929-04-17  Subjective/Objective:  syncope                  Action/Plan: Home Health  Expected Discharge Date:  10/25/2014               Expected Discharge Plan:  Home w Home Health Services  In-House Referral:  Clinical Social Worker  Discharge planning Services  CM Consult  Post Acute Care Choice:  Home Health Choice offered to:  Patient  DME Arranged:    DME Agency:     HH Arranged:  PT HH Agency:  [facility]  Status of Service:  Completed, signed off  Medicare Important Message Given:  Yes-second notification given Date Medicare IM Given:    Medicare IM give by:    Date Additional Medicare IM Given:    Additional Medicare Important Message give by:     If discussed at Long Length of Stay Meetings, dates discussed:    Additional Comments: Contacted [facility] to arrange [facility]. Pt will have a church member to pick him up from hospital post dc.  Elliot Cousin, RN 10/25/2014, 11:16 AM

## 2014-10-25 NOTE — Progress Notes (Signed)
Spoke to pt and offered choice for [facility]. Pt agreeable to [facility]. States he does not need any DME at this time. Isidoro Donning RN CCM Case Manager [phone]

## 2014-10-27 DIAGNOSIS — F0390 Unspecified dementia without behavioral disturbance: Secondary | ICD-10-CM | POA: Diagnosis not present

## 2014-10-27 DIAGNOSIS — Z8546 Personal history of malignant neoplasm of prostate: Secondary | ICD-10-CM | POA: Diagnosis not present

## 2014-10-27 DIAGNOSIS — I482 Chronic atrial fibrillation: Secondary | ICD-10-CM | POA: Diagnosis not present

## 2014-10-27 DIAGNOSIS — N183 Chronic kidney disease, stage 3 (moderate): Secondary | ICD-10-CM | POA: Diagnosis not present

## 2014-10-27 DIAGNOSIS — I951 Orthostatic hypotension: Secondary | ICD-10-CM | POA: Diagnosis not present

## 2014-10-27 DIAGNOSIS — Z7982 Long term (current) use of aspirin: Secondary | ICD-10-CM | POA: Diagnosis not present

## 2014-10-30 ENCOUNTER — Ambulatory Visit: Payer: Medicare Other

## 2014-10-30 ENCOUNTER — Other Ambulatory Visit: Payer: Self-pay

## 2014-10-30 ENCOUNTER — Encounter: Payer: Self-pay | Admitting: *Deleted

## 2014-10-30 DIAGNOSIS — Z8546 Personal history of malignant neoplasm of prostate: Secondary | ICD-10-CM | POA: Diagnosis not present

## 2014-10-30 DIAGNOSIS — F0390 Unspecified dementia without behavioral disturbance: Secondary | ICD-10-CM | POA: Diagnosis not present

## 2014-10-30 DIAGNOSIS — I951 Orthostatic hypotension: Secondary | ICD-10-CM | POA: Diagnosis not present

## 2014-10-30 DIAGNOSIS — N183 Chronic kidney disease, stage 3 (moderate): Secondary | ICD-10-CM | POA: Diagnosis not present

## 2014-10-30 DIAGNOSIS — I482 Chronic atrial fibrillation: Secondary | ICD-10-CM | POA: Diagnosis not present

## 2014-10-30 DIAGNOSIS — Z7982 Long term (current) use of aspirin: Secondary | ICD-10-CM | POA: Diagnosis not present

## 2014-10-30 NOTE — Patient Outreach (Signed)
Unsuccessful attempt made to contact patient via telephone for community care coordination.  HIPP compliant message left with this RNCM's contact information.  Plan: Make another attempt to contact patient by telephone on tomorrow, October 31, 2014

## 2014-10-30 NOTE — Patient Outreach (Signed)
[facility]) Care Management  10/30/2014  Craig Bennett [birth year] [id number]    Referral from Charlesetta Shanks, RN for [employer], assigned [name], RN.  Craig Bennett. [name] [facility] Care Management [facility] CM Assistant Phone: [phone] Fax: [phone]

## 2014-10-30 NOTE — Progress Notes (Signed)
Patient ID: Craig Bennett, male   DOB: [birth year], 81 y.o.   MRN: [id number] Patient did not show up for 10/30/2014 2:30 PM appointment to have a 30 day cardiac event monitor applied.

## 2014-10-31 DIAGNOSIS — N183 Chronic kidney disease, stage 3 (moderate): Secondary | ICD-10-CM | POA: Diagnosis not present

## 2014-10-31 DIAGNOSIS — F0390 Unspecified dementia without behavioral disturbance: Secondary | ICD-10-CM | POA: Diagnosis not present

## 2014-10-31 DIAGNOSIS — I482 Chronic atrial fibrillation: Secondary | ICD-10-CM | POA: Diagnosis not present

## 2014-10-31 DIAGNOSIS — Z8546 Personal history of malignant neoplasm of prostate: Secondary | ICD-10-CM | POA: Diagnosis not present

## 2014-10-31 DIAGNOSIS — Z7982 Long term (current) use of aspirin: Secondary | ICD-10-CM | POA: Diagnosis not present

## 2014-10-31 DIAGNOSIS — I951 Orthostatic hypotension: Secondary | ICD-10-CM | POA: Diagnosis not present

## 2014-11-02 ENCOUNTER — Encounter: Payer: Self-pay | Admitting: *Deleted

## 2014-11-02 DIAGNOSIS — F0390 Unspecified dementia without behavioral disturbance: Secondary | ICD-10-CM | POA: Diagnosis not present

## 2014-11-02 DIAGNOSIS — Z7982 Long term (current) use of aspirin: Secondary | ICD-10-CM | POA: Diagnosis not present

## 2014-11-02 DIAGNOSIS — I482 Chronic atrial fibrillation: Secondary | ICD-10-CM | POA: Diagnosis not present

## 2014-11-02 DIAGNOSIS — Z8546 Personal history of malignant neoplasm of prostate: Secondary | ICD-10-CM | POA: Diagnosis not present

## 2014-11-02 DIAGNOSIS — N183 Chronic kidney disease, stage 3 (moderate): Secondary | ICD-10-CM | POA: Diagnosis not present

## 2014-11-02 DIAGNOSIS — I951 Orthostatic hypotension: Secondary | ICD-10-CM | POA: Diagnosis not present

## 2014-11-02 NOTE — Progress Notes (Signed)
Patient ID: Craig Bennett, male   DOB: 1929/11/22, 81 y.o.   MRN: [id number] Patient did not show up for 11/02/2014 2:00PM appointment to have a cardiac event monitor applied.

## 2014-11-06 ENCOUNTER — Ambulatory Visit (INDEPENDENT_AMBULATORY_CARE_PROVIDER_SITE_OTHER): Payer: Medicare Other

## 2014-11-06 ENCOUNTER — Telehealth: Payer: Self-pay | Admitting: Nurse Practitioner

## 2014-11-06 DIAGNOSIS — R55 Syncope and collapse: Secondary | ICD-10-CM | POA: Diagnosis not present

## 2014-11-06 DIAGNOSIS — R001 Bradycardia, unspecified: Secondary | ICD-10-CM

## 2014-11-06 NOTE — Telephone Encounter (Signed)
Received call directly from operator from [place] with [employer].  She reports she is faxing a report to [place] - states patient had 1st episode of atrial fibrillation of rate of 55-60 bpm.  She reports they have not spoken to the patient.  I thanked her for the call.  Patient has chronic atrial fib and had holter monitor ordered at hospital discharge on 7/27 for evaluation of bradycardia.  Will continue to monitor.

## 2014-11-07 ENCOUNTER — Telehealth: Payer: Self-pay | Admitting: *Deleted

## 2014-11-07 NOTE — Telephone Encounter (Signed)
[employer] faxed pts event monitor (baseline transmission report) over to our office, as discussed with a triage nurse yesterday, showing pt was in afib with SVR.  [employer] did speak with one of our triage nurses about this monitor yesterday in regards to this report.  Pt was asymptomatic when his monitor was placed on at our office, exactly when [employer] called.  Pt has a hx of chronic afib, that has been maintained by his PCP.  Pt was in for an event monitor yesterday, for this was ordered by Discharge MD when pt was discharged from the hospital on 7/27.  According to discharge hospital summary the pt was to follow-up with EP as outpt, stay off of aricept and amiodarone, and wear a holter monitor.  Pt has an est post hospital follow-up appt with Gypsy Balsam NP on 11/29/14 at our office.  Per pts discharge summary from the hospital, EP was consulted while the pt was in the hospital, and EP recommended conservative management for his chronic afib, as well as an event monitor.  Pt is only on ASA 81 mg po daily as his anticoag.  Baseline Monitor report was recorded right when the pt was having it placed on in our office yesterday, at 3:15pm.  Informed our DOD Flex Tereso Newcomer PA-C of this, no new med changes made, continue monitoring.

## 2014-11-08 ENCOUNTER — Other Ambulatory Visit: Payer: Self-pay

## 2014-11-08 DIAGNOSIS — I951 Orthostatic hypotension: Secondary | ICD-10-CM | POA: Diagnosis not present

## 2014-11-08 DIAGNOSIS — I482 Chronic atrial fibrillation: Secondary | ICD-10-CM | POA: Diagnosis not present

## 2014-11-08 DIAGNOSIS — F0390 Unspecified dementia without behavioral disturbance: Secondary | ICD-10-CM | POA: Diagnosis not present

## 2014-11-08 DIAGNOSIS — Z7982 Long term (current) use of aspirin: Secondary | ICD-10-CM | POA: Diagnosis not present

## 2014-11-08 DIAGNOSIS — Z8546 Personal history of malignant neoplasm of prostate: Secondary | ICD-10-CM | POA: Diagnosis not present

## 2014-11-08 DIAGNOSIS — N183 Chronic kidney disease, stage 3 (moderate): Secondary | ICD-10-CM | POA: Diagnosis not present

## 2014-11-09 ENCOUNTER — Other Ambulatory Visit: Payer: Self-pay

## 2014-11-09 NOTE — Patient Outreach (Signed)
Patient was agreeable to this telephonic assessment for community care coordination needs. Patient identified himself using HIPPA identifiers in response.  Patient lives alone, has very good family support.  Patient states he is very active and attends church. Patient is a retired Camera operator.  Plan: Home visit next week to further assess for community care coordination need.

## 2014-11-10 DIAGNOSIS — Z7982 Long term (current) use of aspirin: Secondary | ICD-10-CM | POA: Diagnosis not present

## 2014-11-10 DIAGNOSIS — Z8546 Personal history of malignant neoplasm of prostate: Secondary | ICD-10-CM | POA: Diagnosis not present

## 2014-11-10 DIAGNOSIS — I951 Orthostatic hypotension: Secondary | ICD-10-CM | POA: Diagnosis not present

## 2014-11-10 DIAGNOSIS — N183 Chronic kidney disease, stage 3 (moderate): Secondary | ICD-10-CM | POA: Diagnosis not present

## 2014-11-10 DIAGNOSIS — F0390 Unspecified dementia without behavioral disturbance: Secondary | ICD-10-CM | POA: Diagnosis not present

## 2014-11-10 DIAGNOSIS — I482 Chronic atrial fibrillation: Secondary | ICD-10-CM | POA: Diagnosis not present

## 2014-11-11 ENCOUNTER — Telehealth: Payer: Self-pay | Admitting: Cardiology

## 2014-11-11 NOTE — Telephone Encounter (Signed)
[employer] called, pt had an asymptomatic 3 second pause. No change in Rx, continue to monitor.  Corine Shelter PA-C 11/11/2014 1:15 PM

## 2014-11-13 DIAGNOSIS — I951 Orthostatic hypotension: Secondary | ICD-10-CM | POA: Diagnosis not present

## 2014-11-13 DIAGNOSIS — Z8546 Personal history of malignant neoplasm of prostate: Secondary | ICD-10-CM | POA: Diagnosis not present

## 2014-11-13 DIAGNOSIS — Z7982 Long term (current) use of aspirin: Secondary | ICD-10-CM | POA: Diagnosis not present

## 2014-11-13 DIAGNOSIS — F0390 Unspecified dementia without behavioral disturbance: Secondary | ICD-10-CM | POA: Diagnosis not present

## 2014-11-13 DIAGNOSIS — I482 Chronic atrial fibrillation: Secondary | ICD-10-CM | POA: Diagnosis not present

## 2014-11-13 DIAGNOSIS — N183 Chronic kidney disease, stage 3 (moderate): Secondary | ICD-10-CM | POA: Diagnosis not present

## 2014-11-14 DIAGNOSIS — F0390 Unspecified dementia without behavioral disturbance: Secondary | ICD-10-CM | POA: Diagnosis not present

## 2014-11-14 DIAGNOSIS — I4891 Unspecified atrial fibrillation: Secondary | ICD-10-CM | POA: Diagnosis not present

## 2014-11-14 DIAGNOSIS — R55 Syncope and collapse: Secondary | ICD-10-CM | POA: Diagnosis not present

## 2014-11-14 DIAGNOSIS — Z8546 Personal history of malignant neoplasm of prostate: Secondary | ICD-10-CM | POA: Diagnosis not present

## 2014-11-15 DIAGNOSIS — F0390 Unspecified dementia without behavioral disturbance: Secondary | ICD-10-CM | POA: Diagnosis not present

## 2014-11-15 DIAGNOSIS — I482 Chronic atrial fibrillation: Secondary | ICD-10-CM | POA: Diagnosis not present

## 2014-11-15 DIAGNOSIS — Z8546 Personal history of malignant neoplasm of prostate: Secondary | ICD-10-CM | POA: Diagnosis not present

## 2014-11-15 DIAGNOSIS — N183 Chronic kidney disease, stage 3 (moderate): Secondary | ICD-10-CM | POA: Diagnosis not present

## 2014-11-15 DIAGNOSIS — Z7982 Long term (current) use of aspirin: Secondary | ICD-10-CM | POA: Diagnosis not present

## 2014-11-15 DIAGNOSIS — I951 Orthostatic hypotension: Secondary | ICD-10-CM | POA: Diagnosis not present

## 2014-11-16 DIAGNOSIS — Z7982 Long term (current) use of aspirin: Secondary | ICD-10-CM | POA: Diagnosis not present

## 2014-11-16 DIAGNOSIS — Z8546 Personal history of malignant neoplasm of prostate: Secondary | ICD-10-CM | POA: Diagnosis not present

## 2014-11-16 DIAGNOSIS — I951 Orthostatic hypotension: Secondary | ICD-10-CM | POA: Diagnosis not present

## 2014-11-16 DIAGNOSIS — I482 Chronic atrial fibrillation: Secondary | ICD-10-CM | POA: Diagnosis not present

## 2014-11-16 DIAGNOSIS — N183 Chronic kidney disease, stage 3 (moderate): Secondary | ICD-10-CM | POA: Diagnosis not present

## 2014-11-16 DIAGNOSIS — F0390 Unspecified dementia without behavioral disturbance: Secondary | ICD-10-CM | POA: Diagnosis not present

## 2014-11-20 ENCOUNTER — Other Ambulatory Visit: Payer: Self-pay

## 2014-11-20 DIAGNOSIS — F0390 Unspecified dementia without behavioral disturbance: Secondary | ICD-10-CM | POA: Diagnosis not present

## 2014-11-20 DIAGNOSIS — I482 Chronic atrial fibrillation: Secondary | ICD-10-CM | POA: Diagnosis not present

## 2014-11-20 DIAGNOSIS — Z7982 Long term (current) use of aspirin: Secondary | ICD-10-CM | POA: Diagnosis not present

## 2014-11-20 DIAGNOSIS — I951 Orthostatic hypotension: Secondary | ICD-10-CM | POA: Diagnosis not present

## 2014-11-20 DIAGNOSIS — N183 Chronic kidney disease, stage 3 (moderate): Secondary | ICD-10-CM | POA: Diagnosis not present

## 2014-11-20 DIAGNOSIS — Z8546 Personal history of malignant neoplasm of prostate: Secondary | ICD-10-CM | POA: Diagnosis not present

## 2014-11-20 NOTE — Patient Outreach (Addendum)
This RNCM was successful in making contact with patient via telephone to review needs for community care coordination. Patient identified himself using HIPPA identifiers of date of birth and address.  Care plan update and continuation of community care coordination needs reviewed. This RNCM and patient agreed to initial home visit on Thursday, August 28.  Plan: Home visit on Thursday, August 25.

## 2014-11-21 NOTE — Patient Outreach (Signed)
This RNCM was successful in making telephone contact with patient to assess community case management needs. Patient identified himself using HIPPA identifiers by giving date of birth and sating address.  Patient informed this RNCM he has no plans this week which would require him to be away from home and would be available for  A home visit this week. Patient and this RNCM agreed on Thursday, August 25 for initial home visit.  Plan: Home visit on August 25 to discuss community case management, assess needs for community resources.

## 2014-11-22 DIAGNOSIS — Z8546 Personal history of malignant neoplasm of prostate: Secondary | ICD-10-CM | POA: Diagnosis not present

## 2014-11-22 DIAGNOSIS — I951 Orthostatic hypotension: Secondary | ICD-10-CM | POA: Diagnosis not present

## 2014-11-22 DIAGNOSIS — F0390 Unspecified dementia without behavioral disturbance: Secondary | ICD-10-CM | POA: Diagnosis not present

## 2014-11-22 DIAGNOSIS — Z7982 Long term (current) use of aspirin: Secondary | ICD-10-CM | POA: Diagnosis not present

## 2014-11-22 DIAGNOSIS — N183 Chronic kidney disease, stage 3 (moderate): Secondary | ICD-10-CM | POA: Diagnosis not present

## 2014-11-22 DIAGNOSIS — I482 Chronic atrial fibrillation: Secondary | ICD-10-CM | POA: Diagnosis not present

## 2014-11-23 ENCOUNTER — Other Ambulatory Visit: Payer: Self-pay

## 2014-11-23 DIAGNOSIS — G3183 Dementia with Lewy bodies: Principal | ICD-10-CM

## 2014-11-23 DIAGNOSIS — F0280 Dementia in other diseases classified elsewhere without behavioral disturbance: Secondary | ICD-10-CM

## 2014-11-23 NOTE — Patient Outreach (Signed)
Request from Ellyn Hack, RN to assign SW, [name], LCSW assigned.  [name] [name] Care Management  [name] CM Assistant Phone: [phone]

## 2014-11-23 NOTE — Patient Outreach (Signed)
[facility]) Care Management  [facility] Care Manager  11/23/2014   Kourtland Coopman Robarge,PhD Oct 19, 1929 [id number]  Subjective:  I fell in church one Sunday and had to be admitted to the hospital.  Turns out my heart was beating about 30 beats per minute."  Objective: Current pulse rate 62 beats per minute.    Current Medications:  Current Outpatient Prescriptions  Medication Sig Dispense Refill  . aspirin 81 MG tablet Take 81 mg by mouth at bedtime.     . memantine (NAMENDA) 5 MG tablet Take 1 tablet (5 mg total) by mouth daily. 30 tablet 0  . Multiple Vitamins-Minerals (MULTIVITAMIN WITH MINERALS) tablet Take 1 tablet by mouth at bedtime.      No current facility-administered medications for this visit.    Functional Status:  In your present state of health, do you have any difficulty performing the following activities: 11/09/2014 10/22/2014  Hearing? Y N  Vision? Y N  Difficulty concentrating or making decisions? Y N  Walking or climbing stairs? N N  Dressing or bathing? N N  Doing errands, shopping? N N  Preparing Food and eating ? N -  Using the Toilet? N -  In the past six months, have you accidently leaked urine? N -  Do you have problems with loss of bowel control? N -  Managing your Medications? N -  Managing your Finances? N -  Housekeeping or managing your Housekeeping? N -    Fall/Depression Screening: No flowsheet data found.  Assessment:  Patient lives alone in senior apartment at [facility]. Patient response to questions where with some delay and often had to restate the question of times before responding. Patient has limited support here.  Brother lives in [place], one sister who died a few years back with lung cancer.  Plan:  Referral to LCSW for assistance with advanced planning

## 2014-11-24 DIAGNOSIS — I951 Orthostatic hypotension: Secondary | ICD-10-CM | POA: Diagnosis not present

## 2014-11-24 DIAGNOSIS — Z8546 Personal history of malignant neoplasm of prostate: Secondary | ICD-10-CM | POA: Diagnosis not present

## 2014-11-24 DIAGNOSIS — F0390 Unspecified dementia without behavioral disturbance: Secondary | ICD-10-CM | POA: Diagnosis not present

## 2014-11-24 DIAGNOSIS — Z7982 Long term (current) use of aspirin: Secondary | ICD-10-CM | POA: Diagnosis not present

## 2014-11-24 DIAGNOSIS — I482 Chronic atrial fibrillation: Secondary | ICD-10-CM | POA: Diagnosis not present

## 2014-11-24 DIAGNOSIS — N183 Chronic kidney disease, stage 3 (moderate): Secondary | ICD-10-CM | POA: Diagnosis not present

## 2014-11-28 NOTE — Progress Notes (Signed)
This encounter was created in error - please disregard.

## 2014-11-29 ENCOUNTER — Encounter: Payer: Medicare Other | Admitting: Nurse Practitioner

## 2014-11-29 DIAGNOSIS — F0390 Unspecified dementia without behavioral disturbance: Secondary | ICD-10-CM | POA: Diagnosis not present

## 2014-11-29 DIAGNOSIS — I951 Orthostatic hypotension: Secondary | ICD-10-CM | POA: Diagnosis not present

## 2014-11-29 DIAGNOSIS — Z8546 Personal history of malignant neoplasm of prostate: Secondary | ICD-10-CM | POA: Diagnosis not present

## 2014-11-29 DIAGNOSIS — I482 Chronic atrial fibrillation: Secondary | ICD-10-CM | POA: Diagnosis not present

## 2014-11-29 DIAGNOSIS — Z7982 Long term (current) use of aspirin: Secondary | ICD-10-CM | POA: Diagnosis not present

## 2014-11-29 DIAGNOSIS — N183 Chronic kidney disease, stage 3 (moderate): Secondary | ICD-10-CM | POA: Diagnosis not present

## 2014-11-30 ENCOUNTER — Encounter: Payer: Self-pay | Admitting: Nurse Practitioner

## 2014-11-30 ENCOUNTER — Other Ambulatory Visit: Payer: Self-pay

## 2014-11-30 NOTE — Patient Outreach (Addendum)
This RNCM was successful in making contact with patient via telephone. Patient identified himself using HIPPA identifier by providing date of birth and address.  Patient describes his current situation as being pain free, no signs of dizziness or complaints at [facility] time. This RNCM and patient reviewed his care plan and goals which include advanced planning.  Plan: Home visit on December 12, 2014 for assessment of patients advanced planning

## 2014-12-01 DIAGNOSIS — Z8546 Personal history of malignant neoplasm of prostate: Secondary | ICD-10-CM | POA: Diagnosis not present

## 2014-12-01 DIAGNOSIS — F0390 Unspecified dementia without behavioral disturbance: Secondary | ICD-10-CM | POA: Diagnosis not present

## 2014-12-01 DIAGNOSIS — N183 Chronic kidney disease, stage 3 (moderate): Secondary | ICD-10-CM | POA: Diagnosis not present

## 2014-12-01 DIAGNOSIS — Z7982 Long term (current) use of aspirin: Secondary | ICD-10-CM | POA: Diagnosis not present

## 2014-12-01 DIAGNOSIS — I951 Orthostatic hypotension: Secondary | ICD-10-CM | POA: Diagnosis not present

## 2014-12-01 DIAGNOSIS — I482 Chronic atrial fibrillation: Secondary | ICD-10-CM | POA: Diagnosis not present

## 2014-12-04 DIAGNOSIS — I951 Orthostatic hypotension: Secondary | ICD-10-CM | POA: Diagnosis not present

## 2014-12-04 DIAGNOSIS — I482 Chronic atrial fibrillation: Secondary | ICD-10-CM | POA: Diagnosis not present

## 2014-12-04 DIAGNOSIS — Z8546 Personal history of malignant neoplasm of prostate: Secondary | ICD-10-CM | POA: Diagnosis not present

## 2014-12-04 DIAGNOSIS — N183 Chronic kidney disease, stage 3 (moderate): Secondary | ICD-10-CM | POA: Diagnosis not present

## 2014-12-04 DIAGNOSIS — F0390 Unspecified dementia without behavioral disturbance: Secondary | ICD-10-CM | POA: Diagnosis not present

## 2014-12-04 DIAGNOSIS — Z7982 Long term (current) use of aspirin: Secondary | ICD-10-CM | POA: Diagnosis not present

## 2014-12-05 ENCOUNTER — Other Ambulatory Visit: Payer: Self-pay | Admitting: Licensed Clinical Social Worker

## 2014-12-05 NOTE — Patient Outreach (Signed)
[facility]) Care Management  12/05/2014  Cam Dauphin Winningham,PhD 07-Dec-1929 [id number]   Assessment-CSW received referral from [facility] [name] and CMA Nena Polio to help assist patient with advance directive per patient's wishes. CSW attempted initial outreach attempt on 12/05/14 and was successful in reaching patient. CSW received HIPPA verifications. CSW introduced self, reason for care and of [facility] Care Management Services. CSW discussed with patient his interest in completing advance directives. CSW questioned if he would be willing to CSW completing home visit tomorrow on 12/06/14 to complete advance directives. Patient was willing and appointment was set. Patient reports no other SW needs at this time.   Plan-CSW will complete home visit on 12/06/14 and complete advance directives. CSW will remain available to patient for any further needs.  [name], BSW, MSW, LCSW [email or web address] Phone: [phone] Fax: [phone]

## 2014-12-06 ENCOUNTER — Other Ambulatory Visit: Payer: Self-pay | Admitting: Licensed Clinical Social Worker

## 2014-12-06 DIAGNOSIS — Z7982 Long term (current) use of aspirin: Secondary | ICD-10-CM | POA: Diagnosis not present

## 2014-12-06 DIAGNOSIS — Z8546 Personal history of malignant neoplasm of prostate: Secondary | ICD-10-CM | POA: Diagnosis not present

## 2014-12-06 DIAGNOSIS — F0390 Unspecified dementia without behavioral disturbance: Secondary | ICD-10-CM | POA: Diagnosis not present

## 2014-12-06 DIAGNOSIS — I951 Orthostatic hypotension: Secondary | ICD-10-CM | POA: Diagnosis not present

## 2014-12-06 DIAGNOSIS — I482 Chronic atrial fibrillation: Secondary | ICD-10-CM | POA: Diagnosis not present

## 2014-12-06 DIAGNOSIS — N183 Chronic kidney disease, stage 3 (moderate): Secondary | ICD-10-CM | POA: Diagnosis not present

## 2014-12-06 NOTE — Patient Outreach (Signed)
  [facility]) Care Management  [facility] Social Work  12/06/2014  Craig Culbreath Reichenbach,PhD 16-Dec-1929 [id number]   Current Medications:  Current Outpatient Prescriptions  Medication Sig Dispense Refill  . aspirin 81 MG tablet Take 81 mg by mouth at bedtime.     . memantine (NAMENDA) 5 MG tablet Take 1 tablet (5 mg total) by mouth daily. 30 tablet 0  . Multiple Vitamins-Minerals (MULTIVITAMIN WITH MINERALS) tablet Take 1 tablet by mouth at bedtime.      No current facility-administered medications for this visit.    Functional Status:  In your present state of health, do you have any difficulty performing the following activities: 11/09/2014 10/22/2014  Hearing? Y N  Vision? Y N  Difficulty concentrating or making decisions? Y N  Walking or climbing stairs? N N  Dressing or bathing? N N  Doing errands, shopping? N N  Preparing Food and eating ? N -  Using the Toilet? N -  In the past six months, have you accidently leaked urine? N -  Do you have problems with loss of bowel control? N -  Managing your Medications? N -  Managing your Finances? N -  Housekeeping or managing your Housekeeping? N -    Fall/Depression Screening:  PHQ 2/9 Scores 12/06/2014  PHQ - 2 Score 0    Assessment: CSW completed home visit with patient today on 12/06/14. Patient had forgot scheduled appointment and asked CSW to wait outside for a little while so that he could get dressed. CSW reminded patient of [facility] services and reason for visit. Patient presented some delays during the session CSW reviewed advance directive with patient but patient had a difficult time with answering questions as he wanted additional time to make a decision which CSW encouraged and supported. CSW left reminders on patient's desk for next appointment and to review advance directive with friend [facility] Valentina Lucks , a previous neighbor) and possible POA within the next 2 weeks before next scheduled visit. Patient reported that he  desired time to discuss with family and friends and to gain their input on certain advance directive sections too. CSW completed psychosocial assessment and depression screening with patient. Patient reports no feelings or symptoms of depression. Patient shares that he is not currently experiencing any pain or discomfort. CSW reviewed upcoming medical appointments with patient.  Plan: CSW will make follow up call to remind patient to gather needed information for advance directive and of upcoming appointment. CSW will remain available to patient for any further needs.  [name], BSW, MSW, LCSW [name] Phone: [phone] Fax: [phone]

## 2014-12-11 DIAGNOSIS — I482 Chronic atrial fibrillation: Secondary | ICD-10-CM | POA: Diagnosis not present

## 2014-12-11 DIAGNOSIS — Z7982 Long term (current) use of aspirin: Secondary | ICD-10-CM | POA: Diagnosis not present

## 2014-12-11 DIAGNOSIS — Z8546 Personal history of malignant neoplasm of prostate: Secondary | ICD-10-CM | POA: Diagnosis not present

## 2014-12-11 DIAGNOSIS — I951 Orthostatic hypotension: Secondary | ICD-10-CM | POA: Diagnosis not present

## 2014-12-11 DIAGNOSIS — F0390 Unspecified dementia without behavioral disturbance: Secondary | ICD-10-CM | POA: Diagnosis not present

## 2014-12-11 DIAGNOSIS — N183 Chronic kidney disease, stage 3 (moderate): Secondary | ICD-10-CM | POA: Diagnosis not present

## 2014-12-13 DIAGNOSIS — Z7982 Long term (current) use of aspirin: Secondary | ICD-10-CM | POA: Diagnosis not present

## 2014-12-13 DIAGNOSIS — I951 Orthostatic hypotension: Secondary | ICD-10-CM | POA: Diagnosis not present

## 2014-12-13 DIAGNOSIS — I482 Chronic atrial fibrillation: Secondary | ICD-10-CM | POA: Diagnosis not present

## 2014-12-13 DIAGNOSIS — Z8546 Personal history of malignant neoplasm of prostate: Secondary | ICD-10-CM | POA: Diagnosis not present

## 2014-12-13 DIAGNOSIS — N183 Chronic kidney disease, stage 3 (moderate): Secondary | ICD-10-CM | POA: Diagnosis not present

## 2014-12-13 DIAGNOSIS — F0390 Unspecified dementia without behavioral disturbance: Secondary | ICD-10-CM | POA: Diagnosis not present

## 2014-12-18 ENCOUNTER — Encounter: Payer: Self-pay | Admitting: Licensed Clinical Social Worker

## 2014-12-18 NOTE — Patient Outreach (Signed)
  [facility]) Care Management  [facility] Social Work  12/18/2014  Craig Podesta Scism,PhD [birth year] [id number]   Current Medications:  Current Outpatient Prescriptions  Medication Sig Dispense Refill  . aspirin 81 MG tablet Take 81 mg by mouth at bedtime.     . memantine (NAMENDA) 5 MG tablet Take 1 tablet (5 mg total) by mouth daily. 30 tablet 0  . Multiple Vitamins-Minerals (MULTIVITAMIN WITH MINERALS) tablet Take 1 tablet by mouth at bedtime.      No current facility-administered medications for this visit.    Functional Status:  In your present state of health, do you have any difficulty performing the following activities: 11/09/2014 10/22/2014  Hearing? Y N  Vision? Y N  Difficulty concentrating or making decisions? Y N  Walking or climbing stairs? N N  Dressing or bathing? N N  Doing errands, shopping? N N  Preparing Food and eating ? N -  Using the Toilet? N -  In the past six months, have you accidently leaked urine? N -  Do you have problems with loss of bowel control? N -  Managing your Medications? N -  Managing your Finances? N -  Housekeeping or managing your Housekeeping? N -    Fall/Depression Screening:  PHQ 2/9 Scores 12/06/2014  PHQ - 2 Score 0    Assessment: CSW contacted patient before heading to his place of residence as [name] and CSW had planned to complete a discussion at [facility] on [facility] with residents. Patient reported that he would come down for presentation. Patient did come down for [facility] presentation and afterwards had some difficulty remembering both RNCM and CSW but after some explaining he was able to remember. Patient and CSW had scheduled appointment already tomorrow but since CSW was at his residence already and patient was agreeable, she decided to complete visit then. CSW walked with patient to his apartment. Patient reported that he had forgot to contact his old neighbor to gather further  information in order to complete advance directive. CSW reviewed entire advance directive with him again and left large print sticky notes for each question left blank. Patient reported that he felt it would be best if CSW could come back in another 2 weeks so that he could have more time to reach out to his old neighbor to see if she would be fit for his health care power of attorney or if his brother would be better fit. Patient reported that his brother's new wife is "hovering" and that he can not talk to him alone without her around so he does not think he would be fit. Patient reports that he has not talked to his old neighbor in the past 2 weeks and was going to call her today any ways. CSW informed him that it would be also mportant for him to discuss part b of the advance directives which he has had a lot of difficulty answering. CSW reported that she would call in 1 week to remind him of this task. Appointment was set for 2 weeks.   Plan: CSW will complete a reminder call to complete advance directives. CSW will remain available for all further needs.  [name], BSW, MSW, LCSW [email or web address] Phone: [phone] Fax: [phone]

## 2014-12-19 ENCOUNTER — Ambulatory Visit: Payer: Self-pay | Admitting: Licensed Clinical Social Worker

## 2014-12-21 DIAGNOSIS — I951 Orthostatic hypotension: Secondary | ICD-10-CM | POA: Diagnosis not present

## 2014-12-21 DIAGNOSIS — I482 Chronic atrial fibrillation: Secondary | ICD-10-CM | POA: Diagnosis not present

## 2014-12-21 DIAGNOSIS — N183 Chronic kidney disease, stage 3 (moderate): Secondary | ICD-10-CM | POA: Diagnosis not present

## 2014-12-21 DIAGNOSIS — Z8546 Personal history of malignant neoplasm of prostate: Secondary | ICD-10-CM | POA: Diagnosis not present

## 2014-12-21 DIAGNOSIS — F0390 Unspecified dementia without behavioral disturbance: Secondary | ICD-10-CM | POA: Diagnosis not present

## 2014-12-21 DIAGNOSIS — Z7982 Long term (current) use of aspirin: Secondary | ICD-10-CM | POA: Diagnosis not present

## 2015-01-01 ENCOUNTER — Other Ambulatory Visit: Payer: Self-pay

## 2015-01-01 NOTE — Patient Outreach (Signed)
Assessment of patient's support system completed. Patient lives in senior living apartment at [facility]. Ms. Craig Bennett is site manager is very active in his care and is a primary source of support.  Patient is very active in his church, is a Museum/gallery curator in church choir, members provide transportation to medical appointments.  Patient states he is not ready for long term care placement, is very functional and able to perform ADLs and IADLs is familiar environment.

## 2015-01-08 ENCOUNTER — Other Ambulatory Visit: Payer: Self-pay | Admitting: Licensed Clinical Social Worker

## 2015-01-08 NOTE — Patient Outreach (Signed)
[facility]) Care Management  [facility] Care Manager  01/08/2015   BANDY HONAKER [birth year] [id number]    Current Medications:  Current Outpatient Prescriptions  Medication Sig Dispense Refill  . aspirin 81 MG tablet Take 81 mg by mouth at bedtime.     . memantine (NAMENDA) 5 MG tablet Take 1 tablet (5 mg total) by mouth daily. 30 tablet 0  . Multiple Vitamins-Minerals (MULTIVITAMIN WITH MINERALS) tablet Take 1 tablet by mouth at bedtime.      No current facility-administered medications for this visit.    Functional Status:  In your present state of health, do you have any difficulty performing the following activities: 11/09/2014 10/22/2014  Hearing? Y N  Vision? Y N  Difficulty concentrating or making decisions? Y N  Walking or climbing stairs? N N  Dressing or bathing? N N  Doing errands, shopping? N N  Preparing Food and eating ? N -  Using the Toilet? N -  In the past six months, have you accidently leaked urine? N -  Do you have problems with loss of bowel control? N -  Managing your Medications? N -  Managing your Finances? N -  Housekeeping or managing your Housekeeping? N -    Fall/Depression Screening: PHQ 2/9 Scores 12/06/2014  PHQ - 2 Score 0    Assessment: CSW completed scheduled home visit on 01/08/15. Patient reports that he has talked to his old neighbor and has decided to use her as his health care POA. Patient shares that talking to her has helped with his decision making on life prolonging measures in certain situations. CSW reviewed the advance directive packet with patient again and he was able to complete form appropriately. CSW reminded patient that he must wait to sign and date form until document can be notarized. Patient reports that he can take document to his bank to get notarized. CSW provided patient with another blank advance directive packet in the case where he were to change his mind. CSW encouraged patient to have multiple copies  of advance directive once notarized and to provide one copy to his PCP. Patient expressed understanding. CSW wrote down all steps for patient on a piece of paper to help remind him. Patient continues to benefit from support from church, site Production designer, theatre/television/film at the senior center he resides at and his family. Patient denies any other social work needs at this time and is agreeable to case closure.   Plan: CSW will send case closure letter to PCP and inform CMA Nena Polio of case closure.  [name], BSW, MSW, LCSW [email or web address] Phone: [phone] Fax: [phone]

## 2015-01-09 DIAGNOSIS — Z8546 Personal history of malignant neoplasm of prostate: Secondary | ICD-10-CM | POA: Diagnosis not present

## 2015-01-09 NOTE — Patient Outreach (Signed)
[facility]) Care Management  01/09/2015  ORRIN YURKOVICH [birth year] [id number]   Notification from [name], LCSW to close case due to goals met with [facility] Management.  Thanks, Ronnell Freshwater. [place], [place] Assistant Phone: [phone] Fax: [phone]

## 2015-01-10 DIAGNOSIS — F0390 Unspecified dementia without behavioral disturbance: Secondary | ICD-10-CM | POA: Diagnosis not present

## 2015-01-10 DIAGNOSIS — R413 Other amnesia: Secondary | ICD-10-CM | POA: Diagnosis not present

## 2015-01-11 ENCOUNTER — Encounter: Payer: Self-pay | Admitting: Nurse Practitioner

## 2015-03-22 DIAGNOSIS — E291 Testicular hypofunction: Secondary | ICD-10-CM | POA: Diagnosis not present

## 2015-03-22 DIAGNOSIS — M810 Age-related osteoporosis without current pathological fracture: Secondary | ICD-10-CM | POA: Diagnosis not present

## 2015-03-22 DIAGNOSIS — I129 Hypertensive chronic kidney disease with stage 1 through stage 4 chronic kidney disease, or unspecified chronic kidney disease: Secondary | ICD-10-CM | POA: Diagnosis not present

## 2015-03-22 DIAGNOSIS — I4891 Unspecified atrial fibrillation: Secondary | ICD-10-CM | POA: Diagnosis not present

## 2015-03-22 DIAGNOSIS — Z8546 Personal history of malignant neoplasm of prostate: Secondary | ICD-10-CM | POA: Diagnosis not present

## 2015-03-22 DIAGNOSIS — N182 Chronic kidney disease, stage 2 (mild): Secondary | ICD-10-CM | POA: Diagnosis not present

## 2015-03-28 DIAGNOSIS — M810 Age-related osteoporosis without current pathological fracture: Secondary | ICD-10-CM | POA: Diagnosis not present

## 2015-03-28 DIAGNOSIS — N182 Chronic kidney disease, stage 2 (mild): Secondary | ICD-10-CM | POA: Diagnosis not present

## 2015-03-28 DIAGNOSIS — I129 Hypertensive chronic kidney disease with stage 1 through stage 4 chronic kidney disease, or unspecified chronic kidney disease: Secondary | ICD-10-CM | POA: Diagnosis not present

## 2015-03-28 DIAGNOSIS — I4891 Unspecified atrial fibrillation: Secondary | ICD-10-CM | POA: Diagnosis not present

## 2015-04-12 ENCOUNTER — Other Ambulatory Visit: Payer: Self-pay

## 2015-05-08 NOTE — Patient Outreach (Signed)
[name] Care Management  05/08/2015  Craig Bennett 12-14-1929 [id number]   Patient received mailing from [facility] Care Management and would like additional information.  Thanks, [name]. [name] [facility] Care Management [facility] CM Assistant Phone: [phone] Fax: [phone]

## 2015-05-10 ENCOUNTER — Other Ambulatory Visit: Payer: Self-pay

## 2015-05-10 DIAGNOSIS — I10 Essential (primary) hypertension: Secondary | ICD-10-CM
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
## 2015-05-14 ENCOUNTER — Other Ambulatory Visit: Payer: Self-pay

## 2015-05-15 NOTE — Patient Outreach (Signed)
unsuccessful attempt made to contact patient via telephone to assess need of community care coordination. Attempt call to # [phone]  Plan: Make another attempt to contact on Wednesday, February 15.

## 2015-05-16 ENCOUNTER — Other Ambulatory Visit: Payer: Self-pay

## 2015-05-16 NOTE — Patient Outreach (Signed)
This RNCM was successful in contacting patient for assessment of community care coordination. Patient identified himself by providing his date of birth and address.  patient and this RNCM agreed to home visit next week for community case coordination needs.   Plan: Home visit next week for assessment of need for community care needs.

## 2015-05-23 ENCOUNTER — Other Ambulatory Visit: Payer: Self-pay

## 2015-05-23 NOTE — Patient Outreach (Signed)
[facility]) Care Management  05/23/2015  Lois Ostrom [place] 06/30/1929 [id number]   Iniitla home visit completed. Pateint lives in senior apartment at [facility].  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   Gets his lunch at meals on wheels, breakfast conisist of cereal and some type of prepared meal for dinner. He has

## 2015-05-24 NOTE — Patient Outreach (Signed)
[facility] Customer service manager [facility]) Care Management  May 23, 2015   Craig Bennett [birth year] [id number]    Initial home visit to assess for community care coordination needs. Patient readily participated in assessment.  Patient often had a hard time recalling answers to some of the questions. Patient's apartment was more cluttered than when he was being followed by [facility].  Patient states he likes living in this retirement community in his apartment. Patient states he eats cereal for breakfast, has a meals on wheels dish for lunch and he microwaves a pizza or some other prepared food for dinner.    Patient is well groomed, states he has a very social life at the apartment complex. Unsuccessful attempt made to contact Cathren Laine, Apartment activities coordinator.  Patient agreed to assisting in applying for [place] Medicaid, his monthly income is $940.00.

## 2015-06-18 ENCOUNTER — Other Ambulatory Visit: Payer: Self-pay

## 2015-06-19 NOTE — Progress Notes (Signed)
Unsuccessful attempt made to contact patient via telephone for community care coordination. Plan: Make another attempt on tomorrow, June 19, 2015

## 2015-06-20 ENCOUNTER — Other Ambulatory Visit: Payer: Self-pay

## 2015-06-21 NOTE — Progress Notes (Signed)
  RNCM met with  Patient in his retirement building for community care coordination. This RNCM had received a call from Leota Sauers, Activities Coordinator, advising this RNCM of her concerns with patient displaying more periods of dementia.  Ms. Smith Mince stated patient is not combative, abusive, however, his apartment is more cluttered.  During this home visit, patient was very cooperative, pleasant. Patient often had trouble recalling fact, needing extra time, reminders to follow his train of through.  This RNCM discussed patient's plan for the future.   Patient states his friend from church, listed as his contact has been authorized to make decisions for him if he his not able to.   Patient and RNCM prepared additional paperwork needed to complete his medicaid application. Patient and this RNCM agreed to include Leota Sauers in on Medicaid process. Ms. Smith Mince was not available during this home visit.    Plan: Make contact with Ms. Vanstory to update on Monday, March 27

## 2015-06-25 ENCOUNTER — Ambulatory Visit: Payer: Self-pay

## 2015-06-26 DIAGNOSIS — G3183 Dementia with Lewy bodies: Principal | ICD-10-CM

## 2015-06-26 DIAGNOSIS — F0280 Dementia in other diseases classified elsewhere without behavioral disturbance: Secondary | ICD-10-CM

## 2015-06-27 ENCOUNTER — Other Ambulatory Visit: Payer: Self-pay | Admitting: Licensed Clinical Social Worker

## 2015-06-27 NOTE — Patient Outreach (Signed)
[facility]) Care Management  06/27/2015  Craig MethJames D Bennett 07/10/1929 [id number]   Assessment-CSW [facility]. Patient is in need of a payee and resources of day programs. CSW completed outreach to patient. Patient answered. CSW has worked with patient before but patient does not remember. CSW introduced self, reason for call and of [facility] social work assistance. Patient is agreeable to social work services and home visit for 06/28/15.  CSW contacted RNCM and received additional information on patient. CSW left two HIPPA compliant voice messages with Craig BalesVicki Bennett manager at [facility].  Plan-CSW will complete home visit on 06/28/15.  Craig Bennett Craig Bennett, BSW, MSW, LCSW [facility] Care Management [name] Phone: [phone] Fax: [phone]

## 2015-06-28 ENCOUNTER — Encounter: Payer: Self-pay | Admitting: Licensed Clinical Social Worker

## 2015-06-28 ENCOUNTER — Other Ambulatory Visit: Payer: Self-pay | Admitting: Licensed Clinical Social Worker

## 2015-06-28 NOTE — Patient Outreach (Signed)
[facility]) Care Management  [facility] Social Work  06/28/2015  Craig Bennett 06/04/1929 [id number]    Current Medications:  Current Outpatient Prescriptions  Medication Sig Dispense Refill  . aspirin 81 MG tablet Take 81 mg by mouth at bedtime.     . memantine (NAMENDA) 5 MG tablet Take 1 tablet (5 mg total) by mouth daily. 30 tablet 0  . Multiple Vitamins-Minerals (MULTIVITAMIN WITH MINERALS) tablet Take 1 tablet by mouth at bedtime.      No current facility-administered medications for this visit.    Functional Status:  In your present state of health, do you have any difficulty performing the following activities: 06/28/2015 05/23/2015  Hearing? Craig Bennett  Vision? Y Y  Difficulty concentrating or making decisions? Craig Bennett  Walking or climbing stairs? Y N  Dressing or bathing? Y N  Doing errands, shopping? Craig Bennett  Preparing Food and eating ? - Y  Using the Toilet? - -  In the past six months, have you accidently leaked urine? - N  Do you have problems with loss of bowel control? - N  Managing your Medications? - N  Managing your Finances? - N  Housekeeping or managing your Housekeeping? - N    Fall/Depression Screening:  PHQ 2/9 Scores 06/28/2015 06/18/2015 05/16/2015 01/08/2015 12/06/2014  PHQ - 2 Score 0 0 0 0 0    Assessment: CSW completed home visit with patient on 06/28/15. Patient in need of payee service representative and possible senior day program. Craig Bennett the manager of the senior community center was available during parts of assessment. CSW provided resources to patient which includes transportation resources, adult day centers, senior centers, nutrition centers and general senior resources. CSW reviewed these resources with Craig Bennett as well. Patient does not qualify for PACE as he does not have a family member in the state to servce as an emergency contact. Patient is not agreeable to ACE program as it would cost $70 per day. CSW informed him that he could complete half  days at ACE program and he declined. CSW reviewed available Senior Centers within the area that are free of charge. Patient will consider these. Craig Bennett is familiar with some of these resources that CSW provided. After completing referral to [employer], CSW became aware that patient is already eligible for [employer] and has used services recently for transportation to medical appointments. CSW contacted [employer] to make sure this was accurate. CSW received confirmation. Patient will not be an ideal candidate for [facility] as both patient and Craig Bennett are fearful that he will get lost. Patient will continue to use [employer]   Due to patient's dementia, patient has forgot to pay bills multiple times. Craig Bennett shares that if he continues to forget to pay rent then this could cause him to have to leave current residence. Patient has only one friend as his support network as family lives out of state. Craig Bennett shares that patient was agreeable to payee representative last year but during social service assessment, patient declined services. Patient is now agreeable to services. CSW completed outreach to [facility]. Patient will need to gain a doctor's recommendation letter and then bring this to Social Services in order to apply for program. Program is free of charge. CSW contacted patient's PCP and left message with front desk requesting recommendation. CSW will await to hear back from PCP. Craig Bennett shares that she often talks to patient's only support and can discuss these updates with him.  Patient has not  had advance directive notarized that was completed during a previous visit. CSW educated patient on ways to get document notarized. CSW updated Craig Bennett on this.   Plan: CSW will send involvement letter to PCP. CSW will route entire encounter. CSW will await to hear bacChip Boerk from PCP in regards to payee service recommendation.  [facility] CM Care Plan Problem One        Most Recent Value   Care Plan Problem One  Need  for ongoing support and community resources   Role Documenting the Problem One  Clinical Social Worker   Care Plan for Problem One  Active   [facility] Long Term Goal (31-90 days)  Patient will be recommended to payee representative program within 90 days    [facility] Long Term Goal Start Date  06/28/15   Interventions for Problem One Long Term Goal  CSW will aid patient in applying for payee program and will request that doctor complete letter of recommendation   [facility] CM Short Term Goal #1 (0-30 days)  Patient will gain appropriate community resources within 30 days   [facility] CM Short Term Goal #1 Start Date  06/28/15   Interventions for Short Term Goal #1  CSW will provide patient with a list of community resources for seniors and will complete review of them various times. CSW will also show Craig Bennett, Production designer, theatre/television/filmmanager of senior living these resources.      Craig Bennett, Craig Bennett, Craig Bennett, Craig Bennett [facility] Care Management [email or web address] Phone: [phone] Fax: [phone]

## 2015-06-29 ENCOUNTER — Encounter: Payer: Self-pay | Admitting: Licensed Clinical Social Worker

## 2015-07-03 ENCOUNTER — Other Ambulatory Visit: Payer: Self-pay | Admitting: Licensed Clinical Social Worker

## 2015-07-03 NOTE — Patient Outreach (Signed)
[facility]) Care Management  07/03/2015  Craig MethJames D Bennett 03/16/1930 [id number]   Assessment- CSW completed outreach to patient's PCP to follow up on payee representative recommendation letter. CSW spoke to front desk stating that they are waiting for a response from Dr. Thea SilversmithMackenzie and to call back within a week if I have not heard anything.  Plan-CSW will await to hear back from PCP office for recommendation letter.  Craig Bennett Craig Bennett, BSW, MSW, LCSW [facility] Care Management [email or web address] Phone: [phone] Fax: [phone]

## 2015-07-04 ENCOUNTER — Other Ambulatory Visit: Payer: Self-pay | Admitting: Licensed Clinical Social Worker

## 2015-07-04 NOTE — Patient Outreach (Signed)
[facility]) Care Management  07/04/2015  Craig Bennett 10/12/1929 [id number]   Assessment-CSW received letter of recommendation from PCP. CSW completed outreach to patient. He is agreeable to home visit tomorrow in order to provide document and explain the next steps to gain a payee. CSW completed outreach to [place]. CSW was unsuccessful in reaching her but left a HIPPA compliant voice message.  Plan-CSW will complete home visit tomorrow.  [name] Stefan Karen, BSW, MSW, LCSW [facility] Care Management [email or web address] Phone: [phone] Fax: [phone]

## 2015-07-05 ENCOUNTER — Other Ambulatory Visit: Payer: Self-pay | Admitting: Licensed Clinical Social Worker
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
## 2015-07-06 ENCOUNTER — Other Ambulatory Visit: Payer: Self-pay | Admitting: Licensed Clinical Social Worker

## 2015-07-06 NOTE — Patient Outreach (Addendum)
[facility](THN) Care Management  07/06/2015  Craig Bennett 12/11/1929 [id number]   Assessment-CSW received return call from DSS caseworker who stated that he is unable to discuss patient's case with CSW. He shares that he mailed patient a consent form for patient to sign and mail back. CSW completed outreach to patient. He states that he has not checked him mail box for a few days now but will check. CSW is fearful that patient may lose this consent. CSW completed an additional outreach to General MillsMark Bennett and he stated that he will email consent and CSW can have patient sign it in person.  Plan-CSW will update RNCM. CSW will complete home visit on 07/09/15 in order to have document signed.  Craig Bennett, BSW, MSW, LCSW [facility] Care Management [email or web address] Phone: [phone] Fax: [phone]

## 2015-07-09 ENCOUNTER — Other Ambulatory Visit: Payer: Self-pay | Admitting: Licensed Clinical Social Worker

## 2015-07-09 NOTE — Patient Outreach (Signed)
  [facility]) Care Management  [facility] Social Work  07/09/2015  Craig Bennett Jan 19, 1930 [id number]    Encounter Medications:  Outpatient Encounter Prescriptions as of 07/09/2015  Medication Sig  . aspirin 81 MG tablet Take 81 mg by mouth at bedtime.   . memantine (NAMENDA) 5 MG tablet Take 1 tablet (5 mg total) by mouth daily.  . Multiple Vitamins-Minerals (MULTIVITAMIN WITH MINERALS) tablet Take 1 tablet by mouth at bedtime.    No facility-administered encounter medications on file as of 07/09/2015.    Functional Status:  In your present state of health, do you have any difficulty performing the following activities: 06/28/2015 05/23/2015  Hearing? Craig Bennett  Vision? Y Y  Difficulty concentrating or making decisions? Craig Bennett  Walking or climbing stairs? Y N  Dressing or bathing? Y N  Doing errands, shopping? Craig Bennett  Preparing Food and eating ? - Y  Using the Toilet? - -  In the past six months, have you accidently leaked urine? - N  Do you have problems with loss of bowel control? - N  Managing your Medications? - N  Managing your Finances? - N  Housekeeping or managing your Housekeeping? - N    Fall/Depression Screening:  PHQ 2/9 Scores 06/28/2015 06/18/2015 05/16/2015 01/08/2015 12/06/2014  PHQ - 2 Score 0 0 0 0 0    Assessment: CSW received consent from [facility] caseworker last week and printed document out. CSW completed home visit on 07/09/15 in order for patient to sign consent. Patient met with CSW in the lobby per his choice. He reports that his friend was unable to see him yesterday but that they are still planning on going to social services on 07/11/15 in order for patient to gain a payee representative. CSW provided a copy of card in case patient or friend need to contact her during that time. CSW reviewed consent with patient. Patient is agreeable to authorizing consent for this CSW to communicate with Medicaid caseworker. Patient signed consent.   Plan-CSW will fax  consent on 07/10/15 to caseworker. CSW will then be able to gain updates on patient's Medicaid application status.  [name], BSW, MSW, [email or web address] Phone: [phone] Fax: [phone]    Plan:

## 2015-07-10 ENCOUNTER — Other Ambulatory Visit: Payer: Self-pay | Admitting: Licensed Clinical Social Worker

## 2015-07-10 NOTE — Patient Outreach (Signed)
[facility](THN) Care Management  07/10/2015  Craig Bennett 02/22/1930 [id number]   Assessment- CSW faxed consent [facility]. CSW received call from [name] caseworker for patient. He can now discuss patient's case with CSW. Caseworker shares that patient receives $1,048 per month and is not eligible for full Medicaid unless he meets his deductible of $4,716.00. Patient can qualify for Medicaid MQB but needs to complete additional forms first. Caseworker shares that he mailed them to patient's residence. Patient has tendency to lose document that are mailed to him so caseworker will [facility] tomorrow. Patient has updated RNCM.   Plan-CSW will await for documents to arrive by mail then schedule home visit.  [name] Skyy Nilan, BSW, MSW, LCSW [facility] Care Management [email or web address] Phone: [phone] Fax: [phone]

## 2015-07-11 ENCOUNTER — Other Ambulatory Visit: Payer: Self-pay | Admitting: Licensed Clinical Social Worker

## 2015-07-11 NOTE — Patient Outreach (Signed)
   Assessment-CSW completed outreach attempt on 07/11/15. CSW unable to reach patient successfully. CSW left a HIPPA compliant voice message encouraging patient to return call once available.  Plan-CSW will await return call or complete an additional outreach if needed.  [name] Turki Tapanes, BSW, MSW, LCSW [facility] Care Management [email or web address] Phone: [phone] Fax: [phone]

## 2015-07-11 NOTE — Patient Outreach (Signed)
Craig Bennett [facility]) Care Management  07/11/2015  Craig Bennett Jun 28, 1929 [id number]   Assessment-CSW received call from patient's friend Craig Bennett who is taking him to DSS today to gain a payee representative. CSW was informed that patient lost physician letter. CSW is willing to meet them at DSS today in order to provide document. CSW met patient and family at [place] and provided document then left. CSW received call back from Craig Bennett stating that the [place] social worker stated patient is in need of a physician medical officer statement of patient's capability to manage benefits. CSW was informed by Education officer, museum that he only needed letter of recommendation. Craig Bennett took this form to [facility] office to drop off. CSW faxed it to PCP.  CSW completed outreach to [place] worker for payee representative and left a message with concerns for directions on how patient is to gain a payee representative.  CSW completed outreach to PCP office and left a message for physician.  Plan-CSW will await to receive document back from physician.  Craig Bennett, BSW, MSW, [email or web address] Phone: [phone] Fax: [phone]

## 2015-07-16 ENCOUNTER — Other Ambulatory Visit: Payer: Self-pay | Admitting: Licensed Clinical Social Worker

## 2015-07-16 NOTE — Patient Outreach (Signed)
[facility](THN) Care Management  07/16/2015  Craig Bennett 08/30/1929 [id number]   Assessment-CSW received voice message from Marylene LandAngela [facility] stating that they received payee form fax and that PCP would like for patient to schedule appointment with either PCP or a nurse practitioner.  Plan-CSW will assist patient in schedule appointment with PCP during home visit tomorrow.  [name] Lucus Lambertson, BSW, MSW, LCSW [facility] Care Management [email or web address] Phone: [phone] Fax: [phone]

## 2015-07-16 NOTE — Patient Outreach (Signed)
[facility](THN) Care Management  07/16/2015  Craig Bennett 10/14/1929 [id number]   Assessment-CSW was made aware that documents form Social Services were successfully delivered [facility]. CSW will pick up doctors on 07/17/15 and then will complete home visit. CSW completed outreach to patient. Patient answered and is agreeable to home visit on 07/17/15. CSW completed [facility] to [facility] [name] to inform him that document was delivered successfully. Loraine LericheMark informs CSW to fax document once completed and that document must be delivered to him by 07/23/15.  Plan-CSW will complete home visit on 07/17/15.  [name] Tommie Dejoseph, BSW, MSW, LCSW [facility] Care Management [email or web address] Phone: [phone] Fax: [phone]

## 2015-07-17 ENCOUNTER — Other Ambulatory Visit: Payer: Self-pay | Admitting: Licensed Clinical Social Worker

## 2015-07-17 NOTE — Patient Outreach (Addendum)
[facility]) Care Management  [facility] Social Work  07/17/2015  Craig Bennett 05/29/1929 [id number]    Encounter Medications:  Outpatient Encounter Prescriptions as of 07/17/2015  Medication Sig  . aspirin 81 MG tablet Take 81 mg by mouth at bedtime.   . memantine (NAMENDA) 5 MG tablet Take 1 tablet (5 mg total) by mouth daily.  . Multiple Vitamins-Minerals (MULTIVITAMIN WITH MINERALS) tablet Take 1 tablet by mouth at bedtime.    No facility-administered encounter medications on file as of 07/17/2015.    Functional Status:  In your present state of health, do you have any difficulty performing the following activities: 06/28/2015 05/23/2015  Hearing? [name]  Vision? Y Y  Difficulty concentrating or making decisions? [name]  Walking or climbing stairs? Y N  Dressing or bathing? Y N  Doing errands, shopping? [name]  Preparing Food and eating ? - Y  Using the Toilet? - -  In the past six months, have you accidently leaked urine? - N  Do you have problems with loss of bowel control? - N  Managing your Medications? - N  Managing your Finances? - N  Housekeeping or managing your Housekeeping? - N    Fall/Depression Screening:  PHQ 2/9 Scores 06/28/2015 06/18/2015 05/16/2015 01/08/2015 12/06/2014  PHQ - 2 Score 0 0 0 0 0    Assessment: CSW completed home visit in the coordinators Vicki's office with patient on 07/17/15. Patient needs to complete additional forms in order to complete Medicaid application. Patient needed to complete forms on previous vehicles, life insurance policy, proof of residency and medical bills. CSW will fax additional information to Medicaid caseworker on 07/18/15. Patient is in need of scheduling physician appointment with PCP in order to complete payee representative form. CSW contacted physician office and scheduled appointment for 07/24/15 at 1:45 with a nurse practitioner. CSW also scheduled PCP appointment for patient for May. After payee representative forms  have been completed, patient can take forms to [employer] and apply for program. CSW completed call to [employer] to schedule transportation but they were booked until May. Patient unable to gain stable transportation with [employer] to 07/24/15 appointment but will have [employer] provide transportation to 08/06/15 PCP appointment. CSW completed outreach to patient's friend [name] and questioned if he could provide transportation to appointment next week and he agreed. [name] questioned patient on if he had an optometrist appointment on 07/23/15 that he was suppose to take him to and patient could not remember. CSW contacted optometrist office and appointment is for 07/23/15. [name] will also provide transportation to this appointment as well.   Plan-CSW will complete outreach to patient next week to assist him with applying for Payee representative program. CSW will fax additional information to [facility] caseworker.  [facility] CM Care Plan Problem One        Most Recent Value   Care Plan Problem One  Need for ongoing support and community resources   Role Documenting the Problem One  Clinical Social Worker   Care Plan for Problem One  Active   THN Long Term Goal (31-90 days)  Patient will be recommended to payee representative program within 90 days    THN Long Term Goal Start Date  06/28/15   Interventions for Problem One Long Term Goal  CSW assisted patient in scheduling physician appointment in order for physician to complete payee representative forms. Patient will then go back to Social Services to apply for program.   [facility]  CM Short Term Goal #1 (0-30 days)  Patient will gain appropriate community resources within 30 days   THN CM Short Term Goal #1 Start Date  06/28/15   [facility] CM Short Term Goal #1 Met Date  07/17/15   Interventions for Short Term Goal #1  CSW will provide patient with a list of community resources for seniors and will complete review of them various times. CSW will also show [name],  Freight forwarder of senior living these resources.   THN CM Short Term Goal #2 (0-30 days)  Patient will gain Medicaid MQB within 30 days   THN CM Short Term Goal #2 Start Date  07/17/15   Interventions for Short Term Goal #2  [facility] team have assisted patient with Medicaid application. CSW has communicated closely with Medicaid caseworker. CSW completed home visit today in order to compelte additional information needed for application. CSW will fax document to caseworker tomorrow.       [name], BSW, MSW, [email or web address] Phone: [phone] Fax: [phone]

## 2015-07-17 NOTE — Patient Outreach (Signed)
[facility](THN) Care Management  07/17/2015  Craig Bennett 02/24/1930 [id number]   Assessment-CSW received call from patient's friend and support Craig Bennett on 07/17/15. Friend requesting updates on payee representative. CSW informed him that PCP office contacted CSW and requested that patient schedule appointment in order to complete form. Friend was informed that CSW will help assist with this during home visit today. After forms are completed friend will transport patient to DSS.  Plan-CSW will complete home visit today.  Craig Bennett, Craig Bennett, Craig Bennett, Craig Bennett [facility] Care Management [email or web address] Phone: [phone] Fax: [phone]

## 2015-07-18 ENCOUNTER — Other Ambulatory Visit: Payer: Self-pay | Admitting: Licensed Clinical Social Worker

## 2015-07-18 NOTE — Patient Outreach (Signed)
[facility](THN) Care Management  07/18/2015  Craig Bennett 05/18/1929 [id number]   Assessment-CSW received call from [facility] case[facility]. He is in need of copies of both of patient's bank statements for date 03/31/15 and needs this by 07/23/15. CSW contacted patient's friend [name] and he will take patient to the bank in order to get these copies.  Plan-CSW will follow up with patient tomorrow. [name] Lamon Rotundo, BSW, MSW, LCSW [facility] Care Management [email or web address] Phone: [phone] Fax: [phone]

## 2015-07-19 ENCOUNTER — Other Ambulatory Visit: Payer: Self-pay | Admitting: Licensed Clinical Social Worker

## 2015-07-19 NOTE — Patient Outreach (Signed)
[facility](THN) Care Management  07/19/2015  Craig MethJames D Purtle 10/24/1929 [id number]   Assessment-CSW received call from patient's friend [name] who took patient to bank to get statements for Medicaid application. [name] will be dropping off forms at [facility] office. CSW went to office and faxed documents to [facility] [name]. CSW later received call from caseworker stating that he has approved patient's Medicaid MQB. Caseworker shares that patient's Medicaid MQB was approved for retro for the medicare part b premuims reimbursement from months December 2016 to February 2017 and patient premium will be paid from March 2017 until Februar 2018. Caseworker has mailed an approval letter to patient's residence informing him of new Medicaid caseworker and information.  CSW contacted patient's friend [name] who was still with patient and updated them.  Plan-CSW will follow up with patient next week in regards to payee representative process.  [name] Hanad Leino, BSW, MSW, LCSW [facility] Care Management [email or web address] Phone: [phone] Fax: [phone]

## 2015-07-20 ENCOUNTER — Other Ambulatory Visit: Payer: Self-pay | Admitting: Licensed Clinical Social Worker

## 2015-07-20 NOTE — Patient Outreach (Signed)
[facility](THN) Care Management  07/20/2015  Craig Bennett 08/26/1929 [id number]   Assessment- CSW received voice message from patient's friend Craig Bennett. Friend is working with on his taxes and is unable to find proof of social security benefits. Friend has contacted Social Security but has not been able to contact anyone and has not heard back. CSW completed outreach to patient's friend [name]. CSW provided education on the steps in order to gain proof of social security. Craig Bennett plans to take patient to social security next week.  Plan-CSW will follow up next week for payee representative.  [name] Anelise Staron, BSW, MSW, LCSW [facility] Care Management [email or web address] Phone: [phone] Fax: [phone]

## 2015-07-23 ENCOUNTER — Other Ambulatory Visit: Payer: Self-pay | Admitting: Licensed Clinical Social Worker

## 2015-07-23 NOTE — Patient Outreach (Signed)
[facility](THN) Care Management  07/23/2015  Craig Bennett 10/31/1929 [id number]  Assessment- CSW received incoming call from patient and Craig [facility]. Patient shares that [employer] contacted him to confirm transportation. However, last week [employer] informed CSW that there were not available rides. CSW contacted [employer] on different line and confirmed appointment was scheduled previously and he will have stable transportation to eye appointment with [employer] today.   CSW contacted DSS as no return call was ever completed. CSW left an additional voice message on 07/23/15 asking if a Child psychotherapistsocial worker for the payee representative program will be available tomorrow as they were not available last attempt.   Craig Bennett Craig Bennett, BSW, MSW, LCSW [facility] Care Management [email or web address] Phone: [phone] Fax: [phone]

## 2015-07-25 ENCOUNTER — Other Ambulatory Visit: Payer: Self-pay | Admitting: Licensed Clinical Social Worker

## 2015-07-25 NOTE — Patient Outreach (Signed)
[facility](THN) Care Management  07/25/2015  Craig Bennett 05/08/1929 [id number]   Assessment-CSW received call from patient's friend on 07/24/15. Payee representative form was completed and [facility]. CSW contacted Department of Social Services and spoke to a payee presentative caseworker and CSW informed her that she has talked to several different people with the payee representative program and was told different things in relation to eligibility and what documents were needed to start program. Caseworker contacted supervisor to get clarification on program. Patient is not eligible for payee representative program due to his dementia. CSW informed caseworker that she was was told differently by a payee representative social worker. Patient is not eligible for payee representative program and caseworker transferred call to Alinda Moneyony at [phone], caseworker for legal guardianship. Caseworker provided advice to CSW on where to go from here. CSW contacted Adult Management consultantrotective Services and filed report. SW contacted Department of Social Services and spoke with a different Designer, fashion/clothingpayee representative caseworker. CSW will await to hear back from social worker on decision to open case or not after their review.   CSW contacted Social Services and made referral to the Home Aide services that are free of charge. Wait list is up to two years at this time.  Plan-CSW will make outreach to patient within two weeks to follow up on social work needs.  Craig Bennett, BSW, MSW, LCSW [facility] Care Management [email or web address] Phone: [phone] Fax: [phone]

## 2015-07-25 NOTE — Patient Outreach (Signed)
[facility](THN) Care Management  07/25/2015  Craig Bennett 02/05/1930 [id number]   Assessment-CSW received call from patient's close friend Craig Bennett. Craig Bennett wishes to be emailed the payee representative form that was completed during office visit yesterday Craig Ashing([name] was present during this visit.) CSW completed outreach to patient even though consent has been signed to share information by email. Patient answered. Patient shares "Please send him that. He keeps up with that stuff better than I do." CSW will send secure email to friend Craig Bennett to hold on to form.  Plan-CSW will complete outreach to patient within two weeks.  Craig Bennett Craig Bennett, BSW, MSW, LCSW [facility] Care Management [email or web address] Phone: [phone] Fax: [phone]

## 2015-07-26 ENCOUNTER — Other Ambulatory Visit: Payer: Self-pay | Admitting: Licensed Clinical Social Worker

## 2015-07-26 NOTE — Patient Outreach (Signed)
[facility]) Care Management  07/26/2015  Craig Bennett 10/08/1929 [id number]   Assessment- CSW received call from APS caseworker Craig Bennett who informed CSW that they have reviewed case and decided to open it. APS social worker Craig Bennett will review it. Craig Bennett's number is [phone].  Plan-CSW will await to hear follow up from APS social worker.  Craig Bennett Craig Bennett, BSW, MSW, LCSW [facility] Care [facility] [email or web address] Phone: [phone] Fax: [phone]

## 2015-07-30 ENCOUNTER — Other Ambulatory Visit: Payer: Self-pay | Admitting: Licensed Clinical Social Worker

## 2015-07-30 NOTE — Patient Outreach (Signed)
[facility] Care Management  07/30/2015  Seward MethJames D Guajardo 06/19/1929 [id number]   Assessment-CSW completed outreach attempt today. CSW unable to reach patient successfully. CSW left a HIPPA compliant voice message encouraging patient to return call once available.  Plan-CSW will await return call or complete an additional outreach if needed.  [name] Demetra Moya, BSW, MSW, LCSW [facility] Care [facility] [email or web address] Phone: [phone] Fax: [phone]

## 2015-08-02 ENCOUNTER — Other Ambulatory Visit: Payer: Self-pay | Admitting: Licensed Clinical Social Worker

## 2015-08-02 NOTE — Patient Outreach (Signed)
[facility]) Care Management  08/02/2015  Craig MethJames D Bennett 05/14/1929 [id number]   Assessment-CSW received incoming call from Craig Bennett asking if there were any updates with patient. Craig Bennett has made previous APS report on patient. CSW has not heard back from APS social worker. No updates to provide Craig Bennett. Craig Bennett shares that patient is having a good week. CSW reminded Craig Bennett of upcoming PCP appointment.  Plan-CSW will await to hear back from APS social worker on their findings.  Craig Bennett, Craig Bennett, Craig Bennett, Craig Bennett [facility] Care Management [email or web address] Phone: [phone] Fax: [phone]

## 2015-08-06 ENCOUNTER — Encounter (HOSPITAL_COMMUNITY): Payer: Self-pay | Admitting: Emergency Medicine

## 2015-08-06 ENCOUNTER — Other Ambulatory Visit: Payer: Self-pay | Admitting: Licensed Clinical Social Worker

## 2015-08-06 ENCOUNTER — Emergency Department (HOSPITAL_COMMUNITY): Payer: Medicare PPO

## 2015-08-06 ENCOUNTER — Inpatient Hospital Stay (HOSPITAL_COMMUNITY)
Admission: EM | Admit: 2015-08-06 | Discharge: 2015-08-10 | DRG: 175 | Disposition: A | Discharge status: Skilled Nursing Facility | Payer: Medicare PPO | Attending: Internal Medicine | Admitting: Internal Medicine

## 2015-08-06 DIAGNOSIS — Z86711 Personal history of pulmonary embolism: Secondary | ICD-10-CM | POA: Diagnosis present

## 2015-08-06 DIAGNOSIS — A481 Legionnaires' disease: Secondary | ICD-10-CM | POA: Diagnosis not present

## 2015-08-06 DIAGNOSIS — I481 Persistent atrial fibrillation: Secondary | ICD-10-CM | POA: Diagnosis present

## 2015-08-06 DIAGNOSIS — I5032 Chronic diastolic (congestive) heart failure: Secondary | ICD-10-CM | POA: Diagnosis present

## 2015-08-06 DIAGNOSIS — R0902 Hypoxemia: Secondary | ICD-10-CM | POA: Diagnosis not present

## 2015-08-06 DIAGNOSIS — N182 Chronic kidney disease, stage 2 (mild): Secondary | ICD-10-CM | POA: Diagnosis present

## 2015-08-06 DIAGNOSIS — J181 Lobar pneumonia, unspecified organism: Secondary | ICD-10-CM | POA: Diagnosis not present

## 2015-08-06 DIAGNOSIS — J440 Chronic obstructive pulmonary disease with acute lower respiratory infection: Secondary | ICD-10-CM | POA: Diagnosis present

## 2015-08-06 DIAGNOSIS — R0602 Shortness of breath: Secondary | ICD-10-CM

## 2015-08-06 DIAGNOSIS — I482 Chronic atrial fibrillation, unspecified: Secondary | ICD-10-CM | POA: Diagnosis present

## 2015-08-06 DIAGNOSIS — F0390 Unspecified dementia without behavioral disturbance: Secondary | ICD-10-CM | POA: Diagnosis present

## 2015-08-06 DIAGNOSIS — I2692 Saddle embolus of pulmonary artery without acute cor pulmonale: Secondary | ICD-10-CM | POA: Diagnosis present

## 2015-08-06 DIAGNOSIS — R079 Chest pain, unspecified: Secondary | ICD-10-CM | POA: Diagnosis not present

## 2015-08-06 DIAGNOSIS — I2699 Other pulmonary embolism without acute cor pulmonale: Secondary | ICD-10-CM | POA: Diagnosis not present

## 2015-08-06 DIAGNOSIS — Z7982 Long term (current) use of aspirin: Secondary | ICD-10-CM

## 2015-08-06 DIAGNOSIS — R069 Unspecified abnormalities of breathing: Secondary | ICD-10-CM | POA: Diagnosis not present

## 2015-08-06 DIAGNOSIS — Z8546 Personal history of malignant neoplasm of prostate: Secondary | ICD-10-CM | POA: Diagnosis not present

## 2015-08-06 HISTORY — DX: Disorder of kidney and ureter, unspecified: N28.9

## 2015-08-06 LAB — COMPREHENSIVE METABOLIC PANEL
ALT: 12 U/L — ABNORMAL LOW (ref 17–63)
AST: 20 U/L (ref 15–41)
Albumin: 3.7 g/dL (ref 3.5–5.0)
Alkaline Phosphatase: 76 U/L (ref 38–126)
Anion gap: 11 (ref 5–15)
BILIRUBIN TOTAL: 2.1 mg/dL — AB (ref 0.3–1.2)
BUN: 15 mg/dL (ref 6–20)
CHLORIDE: 103 mmol/L (ref 101–111)
CO2: 24 mmol/L (ref 22–32)
CREATININE: 1.08 mg/dL (ref 0.61–1.24)
Calcium: 9.9 mg/dL (ref 8.9–10.3)
Glucose, Bld: 110 mg/dL — ABNORMAL HIGH (ref 65–99)
POTASSIUM: 4.4 mmol/L (ref 3.5–5.1)
SODIUM: 138 mmol/L (ref 135–145)
TOTAL PROTEIN: 7.3 g/dL (ref 6.5–8.1)

## 2015-08-06 LAB — CBC WITH DIFFERENTIAL/PLATELET
BASOS ABS: 0 10*3/uL (ref 0.0–0.1)
Basophils Relative: 0 %
EOS ABS: 0 10*3/uL (ref 0.0–0.7)
EOS PCT: 0 %
HCT: 44.7 % (ref 39.0–52.0)
HEMOGLOBIN: 15 g/dL (ref 13.0–17.0)
LYMPHS ABS: 0.9 10*3/uL (ref 0.7–4.0)
LYMPHS PCT: 8 %
MCH: 32.4 pg (ref 26.0–34.0)
MCHC: 33.6 g/dL (ref 30.0–36.0)
MCV: 96.5 fL (ref 78.0–100.0)
Monocytes Absolute: 0.7 10*3/uL (ref 0.1–1.0)
Monocytes Relative: 6 %
NEUTROS PCT: 86 %
Neutro Abs: 9.7 10*3/uL — ABNORMAL HIGH (ref 1.7–7.7)
PLATELETS: 200 10*3/uL (ref 150–400)
RBC: 4.63 MIL/uL (ref 4.22–5.81)
RDW: 12.8 % (ref 11.5–15.5)
WBC: 11.3 10*3/uL — AB (ref 4.0–10.5)

## 2015-08-06 LAB — PROTIME-INR
INR: 1.2 (ref 0.00–1.49)
PROTHROMBIN TIME: 15.4 s — AB (ref 11.6–15.2)

## 2015-08-06 LAB — I-STAT TROPONIN, ED
Troponin i, poc: 0.07 ng/mL (ref 0.00–0.08)
Troponin i, poc: 0 ng/mL (ref 0.00–0.08)

## 2015-08-06 LAB — D-DIMER, QUANTITATIVE (NOT AT ARMC): D DIMER QUANT: 13.06 ug{FEU}/mL — AB (ref 0.00–0.50)

## 2015-08-06 LAB — BRAIN NATRIURETIC PEPTIDE: B NATRIURETIC PEPTIDE 5: 98 pg/mL (ref 0.0–100.0)

## 2015-08-06 MED ORDER — SODIUM CHLORIDE 0.9% FLUSH
3.0000 mL | Freq: Two times a day (BID) | INTRAVENOUS | Status: DC
  Administered 2015-08-06 – 2015-08-10 (×5): 3 mL via INTRAVENOUS

## 2015-08-06 MED ORDER — HEPARIN BOLUS VIA INFUSION
4000.0000 [IU] | Freq: Once | INTRAVENOUS | Status: AC
  Administered 2015-08-06: 4000 [IU] via INTRAVENOUS
  Filled 2015-08-06: qty 4000

## 2015-08-06 MED ORDER — MULTI-VITAMIN/MINERALS PO TABS: 1.0000 | ORAL_TABLET | Freq: Every day | ORAL | Status: DC

## 2015-08-06 MED ORDER — HEPARIN (PORCINE) IN NACL 100-0.45 UNIT/ML-% IJ SOLN
1300.0000 [IU]/h | INTRAMUSCULAR | Status: DC
  Administered 2015-08-06 – 2015-08-08 (×3): 1300 [IU]/h via INTRAVENOUS
  Filled 2015-08-06 (×3): qty 250

## 2015-08-06 MED ORDER — MEMANTINE HCL 5 MG PO TABS
5.0000 mg | ORAL_TABLET | Freq: Every day | ORAL | Status: DC
  Administered 2015-08-06 – 2015-08-10 (×5): 5 mg via ORAL
  Filled 2015-08-06 (×5): qty 1

## 2015-08-06 MED ORDER — ACETAMINOPHEN 325 MG PO TABS
650.0000 mg | ORAL_TABLET | Freq: Four times a day (QID) | ORAL | Status: DC | PRN
  Administered 2015-08-08: 650 mg via ORAL
  Filled 2015-08-06: qty 2

## 2015-08-06 MED ORDER — HYDRALAZINE HCL 20 MG/ML IJ SOLN: 10.0000 mg | Freq: Four times a day (QID) | INTRAMUSCULAR | Status: DC | PRN

## 2015-08-06 MED ORDER — ADULT MULTIVITAMIN W/MINERALS CH
1.0000 | ORAL_TABLET | Freq: Every day | ORAL | Status: DC
  Administered 2015-08-06 – 2015-08-09 (×4): 1 via ORAL
  Filled 2015-08-06 (×4): qty 1

## 2015-08-06 MED ORDER — ASPIRIN EC 81 MG PO TBEC
81.0000 mg | DELAYED_RELEASE_TABLET | Freq: Every day | ORAL | Status: DC
  Administered 2015-08-06 – 2015-08-08 (×3): 81 mg via ORAL
  Filled 2015-08-06 (×5): qty 1

## 2015-08-06 MED ORDER — IOPAMIDOL (ISOVUE-370) INJECTION 76%
INTRAVENOUS | Status: AC
  Administered 2015-08-06: 100 mL
  Filled 2015-08-06: qty 100

## 2015-08-06 MED ORDER — SODIUM CHLORIDE 0.9 % IV SOLN
INTRAVENOUS | Status: AC
  Administered 2015-08-06: 22:00:00 via INTRAVENOUS

## 2015-08-06 MED ORDER — ACETAMINOPHEN 650 MG RE SUPP: 650.0000 mg | Freq: Four times a day (QID) | RECTAL | Status: DC | PRN

## 2015-08-06 MED ORDER — IPRATROPIUM-ALBUTEROL 0.5-2.5 (3) MG/3ML IN SOLN
3.0000 mL | Freq: Once | RESPIRATORY_TRACT | Status: AC
  Administered 2015-08-06: 3 mL via RESPIRATORY_TRACT
  Filled 2015-08-06: qty 3

## 2015-08-06 NOTE — H&P (Signed)
History and Physical    Craig MethJames D Friedly ZOX:[id number] DOB: 10/16/1929 DOA: 08/06/2015  Referring MD/NP/PA: Silverio LayYao PCP: Thayer HeadingsMACKENZIE,[name], MD Outpatient Specialists:  Patient coming from: ?home  Chief Complaint: SOB-- sent by PCP  HPI: Craig Bennett is a 82 y.o. male with medical history significant of atrial fibrillation and dementia.  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  He was also unable to take a deep breath without pain.  He went to see his PCP who sent him to the ER to r/o PE.  He has chest pain and shortness of breath that started in the AM of 5/8.  No recent car trips or flight.  No increased pain or swelling in LE per patient.  Patient has dementia and is slow to respond to questions.  He has [facility] as well an open APS case per notes.   ED Course: D dimer was largely elevated and CTA was done that showed a large saddle pulm emboli with suspected infarct in lung.  He was started on heparin in the ER.    Review of Systems: all systems reviewed, negative unless stated above in HPI   Past Medical History  Diagnosis Date  . A-fib (HCC)     a. CHA2DS2VASc = 2, prev on coumadin but d/c'd ~ 07/2014 - told he was in sinus rhythm;  b. 09/2014 amio d/c'd in setting of persistent afib and bradycardia  . Syncope     a. 09/2014  . Dementia     a. 09/2014 Aricept d/c'd in setting of bradycardia.  . Low testosterone   . History of echocardiogram     a. 11/2008 Echo: EF 60-65%, no rwma, mild LVH, triv AI, mild MR, mildly dil LA/RA.  . CKD (chronic kidney disease), stage II   . Chronic diastolic CHF (congestive heart failure) (HCC)   . COPD (chronic obstructive pulmonary disease) (HCC)   . Renal insufficiency     History reviewed. No pertinent past surgical history.   reports that he has never smoked. He has never used smokeless tobacco. He reports that he drinks alcohol. He reports that he does not use illicit drugs.  No Known Allergies  Family  History  Problem Relation Age of Onset  . Other      no premature CAD.     Prior to Admission medications   Medication Sig Start Date End Date Taking? Authorizing Provider  aspirin 81 MG tablet Take 81 mg by mouth at bedtime.    Yes Historical Provider, MD  memantine (NAMENDA) 5 MG tablet Take 1 tablet (5 mg total) by mouth daily. 10/25/14  Yes Marinda ElkAbraham Feliz Ortiz, MD  Multiple Vitamins-Minerals (MULTIVITAMIN WITH MINERALS) tablet Take 1 tablet by mouth at bedtime.    Yes Historical Provider, MD    Physical Exam: Filed Vitals:   08/06/15 1717 08/06/15 1720 08/06/15 1800 08/06/15 1815  BP:  163/92  173/100  Pulse: 82     Temp:      TempSrc:      Resp:    23  Height:   6' 0.05" (1.83 m)   Weight:   76.204 kg (168 lb)   SpO2: 93%         Constitutional: NAD, calm, comfortable- slow to respond to questions Filed Vitals:   08/06/15 1717 08/06/15 1720 08/06/15 1800 08/06/15 1815  BP:  163/92  173/100  Pulse: 82     Temp:  TempSrc:      Resp:    23  Height:   6' 0.05" (1.83 m)   Weight:   76.204 kg (168 lb)   SpO2: 93%      Eyes: PERRL, lids and conjunctivae normal ENMT: Mucous membranes are moist. Posterior pharynx clear of any exudate or lesions Neck: normal, supple, no masses, no thyromegaly Respiratory: clear to auscultation bilaterally, no wheezing, no crackles. Normal respiratory effort. No accessory muscle use.  Cardiovascular: irregular but rate controlled  Abdomen: no tenderness, no masses palpated. No hepatosplenomegaly. Bowel sounds positive.  Musculoskeletal: no clubbing / cyanosis. No joint deformity upper and lower extremities. Good ROM, no contractures. Normal muscle tone.  Skin: LE bruising and discoloration Neurologic: moves all 4 ext Psychiatric: slow to respond to questions    Labs on Admission: I have personally reviewed following labs and imaging studies  CBC:  Recent Labs Lab 08/06/15 1530  WBC 11.3*  NEUTROABS 9.7*  HGB 15.0  HCT 44.7   MCV 96.5  PLT 200   Basic Metabolic Panel:  Recent Labs Lab 08/06/15 1530  NA 138  K 4.4  CL 103  CO2 24  GLUCOSE 110*  BUN 15  CREATININE 1.08  CALCIUM 9.9   GFR: Estimated Creatinine Clearance: 55.9 mL/min (by C-G formula based on Cr of 1.08). Liver Function Tests:  Recent Labs Lab 08/06/15 1530  AST 20  ALT 12*  ALKPHOS 76  BILITOT 2.1*  PROT 7.3  ALBUMIN 3.7   No results for input(s): LIPASE, AMYLASE in the last 168 hours. No results for input(s): AMMONIA in the last 168 hours. Coagulation Profile:  Recent Labs Lab 08/06/15 1530  INR 1.20   Cardiac Enzymes: No results for input(s): CKTOTAL, CKMB, CKMBINDEX, TROPONINI in the last 168 hours. BNP (last 3 results) No results for input(s): PROBNP in the last 8760 hours. HbA1C: No results for input(s): HGBA1C in the last 72 hours. CBG: No results for input(s): GLUCAP in the last 168 hours. Lipid Profile: No results for input(s): CHOL, HDL, LDLCALC, TRIG, CHOLHDL, LDLDIRECT in the last 72 hours. Thyroid Function Tests: No results for input(s): TSH, T4TOTAL, FREET4, T3FREE, THYROIDAB in the last 72 hours. Anemia Panel: No results for input(s): VITAMINB12, FOLATE, FERRITIN, TIBC, IRON, RETICCTPCT in the last 72 hours. Urine analysis:    Component Value Date/Time   COLORURINE YELLOW 10/23/2014 1829   APPEARANCEUR CLEAR 10/23/2014 1829   LABSPEC 1.015 10/23/2014 1829   PHURINE 6.0 10/23/2014 1829   GLUCOSEU NEGATIVE 10/23/2014 1829   HGBUR NEGATIVE 10/23/2014 1829   BILIRUBINUR NEGATIVE 10/23/2014 1829   KETONESUR NEGATIVE 10/23/2014 1829   PROTEINUR NEGATIVE 10/23/2014 1829   UROBILINOGEN 1.0 10/23/2014 1829   NITRITE NEGATIVE 10/23/2014 1829   LEUKOCYTESUR NEGATIVE 10/23/2014 1829   Sepsis Labs: Invalid input(s): PROCALCITONIN, LACTICIDVEN No results found for this or any previous visit (from the past 240 hour(s)).   Radiological Exams on Admission: Dg Chest 2 View  08/06/2015  CLINICAL DATA:   Shortness of breath and chest pain. Severe pain with breathing. EXAM: CHEST - 2 VIEW COMPARISON:  One-view chest x-ray 10/23/2014. FINDINGS: The heart is enlarged. Atherosclerotic calcifications are present at the aortic arch. Bilateral lower lobe airspace disease is present. The costophrenic angle is incompletely imaged. Small effusions are suspected. The lung volumes are low. The upper lung in foot are clear. The visualized soft tissues and bony thorax are unremarkable. IMPRESSION: 1. Bilateral lower lobe airspace disease compatible with pneumonia. 2. Bilateral pleural effusions. 3. Low lung  volumes. Electronically Signed   By: Marin Roberts M.D.   On: 08/06/2015 15:44   Ct Angio Chest Pe W/cm &/or Wo Cm  08/06/2015  CLINICAL DATA:  Shortness of breath while lying flat. Elevated D-dimer. EXAM: CT ANGIOGRAPHY CHEST CT ABDOMEN AND PELVIS WITH CONTRAST TECHNIQUE: Multidetector CT imaging of the chest was performed using the standard protocol during bolus administration of intravenous contrast. Multiplanar CT image reconstructions and MIPs were obtained to evaluate the vascular anatomy. Multidetector CT imaging of the abdomen and pelvis was performed using the standard protocol during bolus administration of intravenous contrast. CONTRAST:  100 cc of Isovue 370 COMPARISON:  None. FINDINGS: CTA CHEST FINDINGS Mediastinum: There is a large saddle embolus involving the right and left pulmonary arteries. There are also bilateral segmental pulmonary artery filling defects as well as subsegmental filling defects. The trachea appears patent and is midline. Normal appearance of the esophagus. There is aortic atherosclerosis noted. LAD coronary artery and left circumflex coronary artery filling defects are identified. Prominent right hilar lymph node measures 9 mm. Lungs/Pleura: Small right pleural effusion is identified. Atelectasis and airspace consolidation within the lung bases noted right greater than left  compatible with pulmonary infarct. In the right upper low there is a 4 x 2 mm nodule (3 mm mean diameter) on image 26 of series 507. Musculoskeletal: Mild spondylosis is noted within the thoracic spine. No aggressive lytic or sclerotic bone lesions. Compression fracture within the lower thoracic spine is identified at the T9 level. This is age indeterminate with loss of 50% of the vertebral body height, image 100 of series 503. There is also a fracture deformity involving the mid body of sternum. This appears chronic and healed. CT ABDOMEN and PELVIS FINDINGS Hepatobiliary: Multiple fluid attenuating structures are identified within the liver compatible with cysts. There is a enhancing lesion within the posterior right lobe of liver which likely represents a hemangioma. This measures 2.5 cm, image 29 of series 601. The gallbladder is normal. No biliary dilatation. Pancreas: Normal appearance of the pancreas. Spleen: Negative Adrenals/Urinary Tract: The adrenal glands are both normal. Unremarkable appearance of both kidneys. Urinary bladder appears normal. Stomach/Bowel: The stomach is within normal limits. The small bowel loops have a normal course and caliber. No obstruction. Normal appearance of the colon. No pathologic dilatation of the small or large bowel loops identified Vascular/Lymphatic: Calcified atherosclerotic disease involves the abdominal aorta. No aneurysm. No enlarged retroperitoneal or mesenteric adenopathy. No enlarged pelvic or inguinal lymph nodes. Reproductive: Seed implants are noted within the prostate gland. Other: There is no ascites or focal fluid collections within the abdomen or pelvis. Previous mesh repair of ventral abdominal wall hernia. Musculoskeletal: The bones appear osteopenic. There is a scoliosis deformity involving the lumbar spine which is convex towards the left. Compression fractures involve T9, L1, L2, L3 and L4. L5-S1 degenerative disc disease noted. Review of the MIP  images confirms the above findings. IMPRESSION: 1. Examination is positive for large volume pulmonary embolus including saddle embolus. Critical Value/emergent results were called by telephone at the time of interpretation on 08/06/2015 at 6:12 pm to Dr. Chaney Malling , who verbally acknowledged these results. 2. Airspace consolidation within the lung bases likely reflecting pulmonary infarct. 3. No evidence for bowel obstruction. 4. Liver cysts and hemangioma 5. Aortic atherosclerosis 6. Compression fractures are noted involving the T9, L1, L2, L3, L4 vertebra. These are all age indeterminate. There are is also an old sternal fracture. Electronically Signed   By: Ladona Ridgel  Bradly Chris M.D.   On: 08/06/2015 18:13   Ct Abdomen Pelvis W Contrast  08/06/2015  CLINICAL DATA:  Shortness of breath while lying flat. Elevated D-dimer. EXAM: CT ANGIOGRAPHY CHEST CT ABDOMEN AND PELVIS WITH CONTRAST TECHNIQUE: Multidetector CT imaging of the chest was performed using the standard protocol during bolus administration of intravenous contrast. Multiplanar CT image reconstructions and MIPs were obtained to evaluate the vascular anatomy. Multidetector CT imaging of the abdomen and pelvis was performed using the standard protocol during bolus administration of intravenous contrast. CONTRAST:  100 cc of Isovue 370 COMPARISON:  None. FINDINGS: CTA CHEST FINDINGS Mediastinum: There is a large saddle embolus involving the right and left pulmonary arteries. There are also bilateral segmental pulmonary artery filling defects as well as subsegmental filling defects. The trachea appears patent and is midline. Normal appearance of the esophagus. There is aortic atherosclerosis noted. LAD coronary artery and left circumflex coronary artery filling defects are identified. Prominent right hilar lymph node measures 9 mm. Lungs/Pleura: Small right pleural effusion is identified. Atelectasis and airspace consolidation within the lung bases noted right  greater than left compatible with pulmonary infarct. In the right upper low there is a 4 x 2 mm nodule (3 mm mean diameter) on image 26 of series 507. Musculoskeletal: Mild spondylosis is noted within the thoracic spine. No aggressive lytic or sclerotic bone lesions. Compression fracture within the lower thoracic spine is identified at the T9 level. This is age indeterminate with loss of 50% of the vertebral body height, image 100 of series 503. There is also a fracture deformity involving the mid body of sternum. This appears chronic and healed. CT ABDOMEN and PELVIS FINDINGS Hepatobiliary: Multiple fluid attenuating structures are identified within the liver compatible with cysts. There is a enhancing lesion within the posterior right lobe of liver which likely represents a hemangioma. This measures 2.5 cm, image 29 of series 601. The gallbladder is normal. No biliary dilatation. Pancreas: Normal appearance of the pancreas. Spleen: Negative Adrenals/Urinary Tract: The adrenal glands are both normal. Unremarkable appearance of both kidneys. Urinary bladder appears normal. Stomach/Bowel: The stomach is within normal limits. The small bowel loops have a normal course and caliber. No obstruction. Normal appearance of the colon. No pathologic dilatation of the small or large bowel loops identified Vascular/Lymphatic: Calcified atherosclerotic disease involves the abdominal aorta. No aneurysm. No enlarged retroperitoneal or mesenteric adenopathy. No enlarged pelvic or inguinal lymph nodes. Reproductive: Seed implants are noted within the prostate gland. Other: There is no ascites or focal fluid collections within the abdomen or pelvis. Previous mesh repair of ventral abdominal wall hernia. Musculoskeletal: The bones appear osteopenic. There is a scoliosis deformity involving the lumbar spine which is convex towards the left. Compression fractures involve T9, L1, L2, L3 and L4. L5-S1 degenerative disc disease noted.  Review of the MIP images confirms the above findings. IMPRESSION: 1. Examination is positive for large volume pulmonary embolus including saddle embolus. Critical Value/emergent results were called by telephone at the time of interpretation on 08/06/2015 at 6:12 pm to Dr. Chaney Malling , who verbally acknowledged these results. 2. Airspace consolidation within the lung bases likely reflecting pulmonary infarct. 3. No evidence for bowel obstruction. 4. Liver cysts and hemangioma 5. Aortic atherosclerosis 6. Compression fractures are noted involving the T9, L1, L2, L3, L4 vertebra. These are all age indeterminate. There are is also an old sternal fracture. Electronically Signed   By: Signa Kell M.D.   On: 08/06/2015 18:13    EKG:  Independently reviewed. Atrial fib  Assessment/Plan Active Problems:   Dementia   Chronic atrial fibrillation (HCC)   Pulmonary embolus (HCC)    Acute saddle PE with pulm infarct -echo -heparin gtt -not currently unstable so no need for TPA- will place in SDU for monitoring -duplex LE  Dementia -meds at home -[place] -called [facility] social worker  Chronic a fib -rate controlled -on no BP agent nor anticoagulation -? Fall risk -PT consult  Old compression fractures  H/o prostate cancer   DVT prophylaxis:  Heparin gtt started in ER Code Status: full- presumed Family Communication: no family available-- LM for [facility] social worker Disposition Plan: inpt Consults called:  Admission status: SDU   Taeveon Keesling Juanetta Gosling DO [facility] Pager [phone]  If 7PM-7AM, please contact night-coverage [email or web address] Password [facility]  08/06/2015, 6:35 PM

## 2015-08-06 NOTE — ED Provider Notes (Signed)
CSN: [id number]     Arrival date & time 08/06/15  1405 History   First MD Initiated Contact with Patient 08/06/15 1428     Chief Complaint  Patient presents with  . Shortness of Breath     (Consider location/radiation/quality/duration/timing/severity/associated sxs/prior Treatment) HPI  82 year old male with a history of atrial fibrillation, dementia, COPD, chronic diastolic CHF, CK D presents with concern for dyspnea. Patient reports shortness of breath beginning this morning, with the sensation that he cannot take a deep breath.  Patient denies chest pain, cough, or fever. PCP sent patient to the emergency department for evaluation of pulmonary embolus.  Reports SOB began today. Reports he sings and because of difficulty taking a deep breath he cannot.  Reports discomfort with deep breaths but denies CP. No cough, no fever, no other symptoms. Nothing makes it better or worse.  "just hard to get a breath in"  .  Denies known hx of COPD or trying inhalers.  Pt with dementia and unclear if reliabel historian.   Past Medical History  Diagnosis Date  . A-fib (HCC)     a. CHA2DS2VASc = 2, prev on coumadin but d/c'd ~ 07/2014 - told he was in sinus rhythm;  b. 09/2014 amio d/c'd in setting of persistent afib and bradycardia  . Syncope     a. 09/2014  . Dementia     a. 09/2014 Aricept d/c'd in setting of bradycardia.  . Low testosterone   . History of echocardiogram     a. 11/2008 Echo: EF 60-65%, no rwma, mild LVH, triv AI, mild MR, mildly dil LA/RA.  . CKD (chronic kidney disease), stage II   . Chronic diastolic CHF (congestive heart failure) (HCC)   . COPD (chronic obstructive pulmonary disease) (HCC)   . Renal insufficiency    History reviewed. No pertinent past surgical history. Family History  Problem Relation Age of Onset  . Other      no premature CAD.   Social History  Substance Use Topics  . Smoking status: Never Smoker   . Smokeless tobacco: Never Used  . Alcohol Use: Yes    Comment: "very little, occasional wine with food."    Review of Systems  Constitutional: Negative for fever.  HENT: Negative for sore throat.   Eyes: Negative for visual disturbance.  Respiratory: Positive for shortness of breath.   Cardiovascular: Negative for chest pain.  Gastrointestinal: Negative for nausea, vomiting and abdominal pain.  Genitourinary: Negative for difficulty urinating.  Musculoskeletal: Negative for back pain and neck stiffness.  Skin: Negative for rash.  Neurological: Negative for syncope and headaches.      Allergies  Review of patient's allergies indicates no known allergies.  Home Medications   Prior to Admission medications   Medication Sig Start Date End Date Taking? Authorizing Provider  aspirin 81 MG tablet Take 81 mg by mouth at bedtime.    Yes Historical Provider, MD  memantine (NAMENDA) 5 MG tablet Take 1 tablet (5 mg total) by mouth daily. 10/25/14  Yes Marinda ElkAbraham Feliz Ortiz, MD  Multiple Vitamins-Minerals (MULTIVITAMIN WITH MINERALS) tablet Take 1 tablet by mouth at bedtime.    Yes Historical Provider, MD   BP 163/92 mmHg  Pulse 82  Temp(Src) 97.9 F (36.6 C) (Oral)  Resp 14  SpO2 93% Physical Exam  Constitutional: He appears well-developed and well-nourished. No distress.  HENT:  Head: Normocephalic and atraumatic.  Eyes: Conjunctivae and EOM are normal.  Neck: Normal range of motion. No JVD present.  Cardiovascular:  Normal rate, normal heart sounds and intact distal pulses.  An irregularly irregular rhythm present. Exam reveals no gallop and no friction rub.   No murmur heard. Pulmonary/Chest: Effort normal and breath sounds normal. No respiratory distress. He has no wheezes. He has no rales.  Abdominal: Soft. He exhibits no distension. There is no tenderness. There is no guarding.  Musculoskeletal: He exhibits edema (trace).  Neurological: He is alert.  Skin: Skin is warm and dry. He is not diaphoretic.  Nursing note and vitals  reviewed.   ED Course  Procedures (including critical care time) Labs Review Labs Reviewed  COMPREHENSIVE METABOLIC PANEL - Abnormal; Notable for the following:    Glucose, Bld 110 (*)    ALT 12 (*)    Total Bilirubin 2.1 (*)    All other components within normal limits  CBC WITH DIFFERENTIAL/PLATELET - Abnormal; Notable for the following:    WBC 11.3 (*)    Neutro Abs 9.7 (*)    All other components within normal limits  D-DIMER, QUANTITATIVE (NOT AT [facility]) - Abnormal; Notable for the following:    D-Dimer, Quant 13.06 (*)    All other components within normal limits  PROTIME-INR - Abnormal; Notable for the following:    Prothrombin Time 15.4 (*)    All other components within normal limits  BRAIN NATRIURETIC PEPTIDE  I-STAT TROPOININ, ED  Rosezena Sensor, ED    Imaging Review Dg Chest 2 View  08/06/2015  CLINICAL DATA:  Shortness of breath and chest pain. Severe pain with breathing. EXAM: CHEST - 2 VIEW COMPARISON:  One-view chest x-ray 10/23/2014. FINDINGS: The heart is enlarged. Atherosclerotic calcifications are present at the aortic arch. Bilateral lower lobe airspace disease is present. The costophrenic angle is incompletely imaged. Small effusions are suspected. The lung volumes are low. The upper lung in foot are clear. The visualized soft tissues and bony thorax are unremarkable. IMPRESSION: 1. Bilateral lower lobe airspace disease compatible with pneumonia. 2. Bilateral pleural effusions. 3. Low lung volumes. Electronically Signed   By: Marin Roberts M.D.   On: 08/06/2015 15:44   I have personally reviewed and evaluated these images and lab results as part of my medical decision-making.   EKG Interpretation   Date/Time:  Monday Aug 06 2015 14:12:55 EDT Ventricular Rate:  92 PR Interval:    QRS Duration: 93 QT Interval:  363 QTC Calculation: 449 R Axis:   -35 Text Interpretation:  Atrial fibrillation Ventricular premature complex  Left axis deviation Since  previous tracing, heart rate has increased  Confirmed by [facility] MD, Kindle Strohmeier ([id number]) on 08/06/2015 2:58:18 PM      MDM   Final diagnoses:  Shortness of breath   82 year old male with a history of atrial fibrillation, dementia, COPD, chronic diastolic CHF, CK D presents with concern for dyspnea. Patient reports shortness of breath beginning this morning, with the sensation that he cannot take a deep breath.  Patient denies chest pain, cough, or fever. PCP sent patient to the emergency department for evaluation of pulmonary embolus.  Patient without wheezing, and denies history of COPD, however unclear if he is a reliable historian with his history of dementia. Do not feel symptoms are consistent with COPD exacerbation. Ordered labs including BNP, d-dimer which are pending at time of transfer care to Dr. Silverio Lay. Chest x-ray shows a bilateral opacities, however clinical history is not clearly pneumonia, and we'll continue to evaluate for other etiologies of symptoms such as pulmonary embolus before initiating abx.   Care  transferred with laboratory work and likely CT angio pending.   Alvira Monday, MD 08/06/15 1757

## 2015-08-06 NOTE — ED Notes (Signed)
RN found pt out of bed very confused about his situation.  He was able to be re-oriented.  Pt was looking for the restroom but could not figure out "what all these ropes are doing tied to me."  Pt was cleaned and linens were changed.

## 2015-08-06 NOTE — Patient Outreach (Signed)
[facility] Care Management  08/06/2015  Craig Bennett 07/04/1929 [id number]   Assessment-CSW received incoming call from [employer] stating that patient went to PCP appointment and PCP suggested that he go to the ED due to SOB and chest pain. Patient is currently at ED right now.  Plan-CSW will update RNCM and will follow up tomorrow.  [name] Sheronica Corey, BSW, MSW, LCSW [facility] Care Management [email or web address] Phone: [phone] Fax: [phone]

## 2015-08-06 NOTE — Progress Notes (Signed)
ANTICOAGULATION CONSULT NOTE - Initial Consult  Pharmacy Consult for Heparin Indication: pulmonary embolus  No Known Allergies  Patient Measurements: Height: 6' 0.05" (183 cm) Weight: 168 lb (76.204 kg) IBW/kg (Calculated) : 77.71 Heparin Dosing Weight: 76 kg  Vital Signs: Temp: 97.9 F (36.6 C) (05/08 1410) Temp Source: Oral (05/08 1410) BP: 163/92 mmHg (05/08 1720) Pulse Rate: 82 (05/08 1717)  Labs:  Recent Labs  08/06/15 1530  HGB 15.0  HCT 44.7  PLT 200  LABPROT 15.4*  INR 1.20  CREATININE 1.08    CrCl cannot be calculated (Unknown ideal weight.).   Medical History: Past Medical History  Diagnosis Date  . A-fib (HCC)     a. CHA2DS2VASc = 2, prev on coumadin but d/c'd ~ 07/2014 - told he was in sinus rhythm;  b. 09/2014 amio d/c'd in setting of persistent afib and bradycardia  . Syncope     a. 09/2014  . Dementia     a. 09/2014 Aricept d/c'd in setting of bradycardia.  . Low testosterone   . History of echocardiogram     a. 11/2008 Echo: EF 60-65%, no rwma, mild LVH, triv AI, mild MR, mildly dil LA/RA.  . CKD (chronic kidney disease), stage II   . Chronic diastolic CHF (congestive heart failure) (HCC)   . COPD (chronic obstructive pulmonary disease) (HCC)   . Renal insufficiency     Medications:   (Not in a hospital admission) Scheduled:  Infusions:   Assessment: 82yo male with history of Afib not on anticoag presents with SOB. Pharmacy is consulted to dose heparin for PE.  Goal of Therapy:  Heparin level 0.3-0.7 units/ml Monitor platelets by anticoagulation protocol: Yes   Plan:  Give 4000 units bolus x 1 Start heparin infusion at 1300 units/hr Check anti-Xa level in 8 hours and daily while on heparin Continue to monitor H&H and platelets  Arlean Hoppingorey M. Newman PiesBall, PharmD, BCPS Clinical Pharmacist Pager [phone] 08/06/2015,6:05 PM

## 2015-08-06 NOTE — ED Provider Notes (Signed)
  Physical Exam  BP 163/92 mmHg  Pulse 82  Temp(Src) 97.9 F (36.6 C) (Oral)  Resp 14  Ht 6' 0.05" (1.83 m)  Wt 168 lb (76.204 kg)  BMI 22.75 kg/m2  SpO2 93%  Physical Exam  ED Course  Procedures  MDM Care assumed at 4:30 pm from Dr. Dalene SeltzerSchlossman. Patient here with shortness of breath and can't take a deep breath this morning. Went to the office, O2 92% and subjectively short of breath so sent for r/o PE. O2 92-95% on RA, not tachy or hypotensive. Slightly short of breath. D-dimer positive. Sign out pending CT angio chest. CT showed saddle embolus. Vitals stable. Started on heparin. Will admit to stepdown.   CRITICAL CARE Performed by: Silverio LayYAO, DAVID   Total critical care time: 30 minutes  Critical care time was exclusive of separately billable procedures and treating other patients.  Critical care was necessary to treat or prevent imminent or life-threatening deterioration.  Critical care was time spent personally by me on the following activities: development of treatment plan with patient and/or surrogate as well as nursing, discussions with consultants, evaluation of patient's response to treatment, examination of patient, obtaining history from patient or surrogate, ordering and performing treatments and interventions, ordering and review of laboratory studies, ordering and review of radiographic studies, pulse oximetry and re-evaluation of patient's condition.   Richardean Canalavid H Yao, MD 08/06/15 [phone]

## 2015-08-06 NOTE — ED Notes (Signed)
Per [facility] patient sent from PCP office to rule out PE.  Patients he has experienced shortness of breath and chest pain since this morning.  Went to PCP for evaluation, patient states he was told "it was beyond their scope".  Patient states he lives alone, has history of afib.  Patient alert and complains of pain with deep breaths at this time.

## 2015-08-06 NOTE — Patient Outreach (Signed)
[facility]) Care Management  08/06/2015  Craig Bennett 05/18/1929 [id number]   Assessment-CSW completed outreach to patient in order to schedule home visit. Patient answered. CSW reminded him of PCP appointment today. Patient reports that he has not heard from volunteer driver with [employer]. Patient is agreeable to home visit tomorrow.   CSW contacted [employer]. Patient is on the schedule today. [employer] will have Teacher, adult educationvolunteer driver contact CSW to provide what time they will be there to pick him up.   Plan-CSW will complete home visit on 08/07/15.  Craig Bennett Imanol Bihl, BSW, MSW, LCSW [facility] Care [facility] [email or web address] Phone: [phone] Fax: [phone]

## 2015-08-07 ENCOUNTER — Ambulatory Visit: Payer: Self-pay | Admitting: Licensed Clinical Social Worker

## 2015-08-07 ENCOUNTER — Other Ambulatory Visit: Payer: Self-pay | Admitting: Licensed Clinical Social Worker

## 2015-08-07 ENCOUNTER — Inpatient Hospital Stay (HOSPITAL_COMMUNITY): Payer: Medicare PPO

## 2015-08-07 DIAGNOSIS — I2699 Other pulmonary embolism without acute cor pulmonale: Secondary | ICD-10-CM

## 2015-08-07 LAB — BASIC METABOLIC PANEL
Anion gap: 10 (ref 5–15)
BUN: 12 mg/dL (ref 6–20)
CALCIUM: 9.1 mg/dL (ref 8.9–10.3)
CO2: 23 mmol/L (ref 22–32)
CREATININE: 0.89 mg/dL (ref 0.61–1.24)
Chloride: 104 mmol/L (ref 101–111)
GFR calc Af Amer: >60 mL/min (ref >60)
GFR calc non Af Amer: >60 mL/min (ref >60)
GLUCOSE: 108 mg/dL — AB (ref 65–99)
POTASSIUM: 3.9 mmol/L (ref 3.5–5.1)
SODIUM: 137 mmol/L (ref 135–145)

## 2015-08-07 LAB — CBC
HCT: 39.4 % (ref 39.0–52.0)
HEMOGLOBIN: 13.1 g/dL (ref 13.0–17.0)
MCH: 32 pg (ref 26.0–34.0)
MCHC: 33.2 g/dL (ref 30.0–36.0)
MCV: 96.3 fL (ref 78.0–100.0)
Platelets: 193 10*3/uL (ref 150–400)
RBC: 4.09 MIL/uL — AB (ref 4.22–5.81)
RDW: 12.8 % (ref 11.5–15.5)
WBC: 9.9 10*3/uL (ref 4.0–10.5)

## 2015-08-07 LAB — MRSA PCR SCREENING: MRSA BY PCR: NEGATIVE

## 2015-08-07 LAB — HEPARIN LEVEL (UNFRACTIONATED)
HEPARIN UNFRACTIONATED: 0.46 [IU]/mL (ref 0.30–0.70)
Heparin Unfractionated: 0.48 IU/mL (ref 0.30–0.70)

## 2015-08-07 LAB — ECHOCARDIOGRAM COMPLETE
HEIGHTINCHES: 72 in
WEIGHTICAEL: 2627.88 [oz_av]

## 2015-08-07 MED ORDER — ACETAMINOPHEN 650 MG RE SUPP: 650.0000 mg | RECTAL | Status: DC | PRN

## 2015-08-07 NOTE — Progress Notes (Signed)
VASCULAR LAB PRELIMINARY  PRELIMINARY  PRELIMINARY  PRELIMINARY  Bilateral lower extremity venous duplex  completed.    Preliminary report:  Bilateral:  No evidence of DVT, superficial thrombosis, or Baker's Cyst.    Derry Arbogast, RVT 08/07/2015, 10:32 AM

## 2015-08-07 NOTE — Progress Notes (Signed)
ANTICOAGULATION CONSULT NOTE - Initial Consult  Pharmacy Consult for Heparin Indication: pulmonary embolus  No Known Allergies  Patient Measurements: Height: 6' (182.9 cm) Weight: 164 lb 3.9 oz (74.5 kg) IBW/kg (Calculated) : 77.6 Heparin Dosing Weight: 76 kg  Vital Signs: Temp: 98 F (36.7 C) (05/09 1208) Temp Source: Oral (05/09 1208) BP: 131/68 mmHg (05/09 1208) Pulse Rate: 98 (05/09 1208)  Labs:  Recent Labs  08/06/15 1530 08/07/15 0512 08/07/15 1238  HGB 15.0 13.1  --   HCT 44.7 39.4  --   PLT 200 193  --   LABPROT 15.4*  --   --   INR 1.20  --   --   HEPARINUNFRC  --  0.46 0.48  CREATININE 1.08 0.89  --     Estimated Creatinine Clearance: 66.3 mL/min (by C-G formula based on Cr of 0.89).   Medical History: Past Medical History  Diagnosis Date  . A-fib (HCC)     a. CHA2DS2VASc = 2, prev on coumadin but d/c'd ~ 07/2014 - told he was in sinus rhythm;  b. 09/2014 amio d/c'd in setting of persistent afib and bradycardia  . Syncope     a. 09/2014  . Dementia     a. 09/2014 Aricept d/c'd in setting of bradycardia.  . Low testosterone   . History of echocardiogram     a. 11/2008 Echo: EF 60-65%, no rwma, mild LVH, triv AI, mild MR, mildly dil LA/RA.  . CKD (chronic kidney disease), stage II   . Chronic diastolic CHF (congestive heart failure) (HCC)   . COPD (chronic obstructive pulmonary disease) (HCC)   . Renal insufficiency     Medications:  Prescriptions prior to admission  Medication Sig Dispense Refill Last Dose  . aspirin 81 MG tablet Take 81 mg by mouth at bedtime.    08/05/2015 at Unknown time  . memantine (NAMENDA) 5 MG tablet Take 1 tablet (5 mg total) by mouth daily. 30 tablet 0 08/05/2015 at Unknown time  . Multiple Vitamins-Minerals (MULTIVITAMIN WITH MINERALS) tablet Take 1 tablet by mouth at bedtime.    08/05/2015 at Unknown time   Scheduled:  Infusions:   Assessment: 82yo male on IV heparin for acute saddle PE with pulm infarct. Heparin level  0.48 therapeutic on 1300 units/hr. CBC stable. No bleeding noted per chart.  Goal of Therapy:  Heparin level 0.3-0.7 units/ml Monitor platelets by anticoagulation protocol: Yes   Plan:  - Continue IV heparin 1300 units/hr - Daily heparin level and CBC - f/u plans for oral anticoagulation.    Bayard HuggerMei Tristian Bouska, PharmD, BCPS  Clinical Pharmacist  Pager: [phone]   08/07/2015,2:27 PM

## 2015-08-07 NOTE — Progress Notes (Signed)
PROGRESS NOTE    Craig Bennett  YQM:[id number] DOB: 04/12/1929 DOA: 08/06/2015 PCP: Thayer Headings, MD   Outpatient Specialists:     Brief Narrative:  Craig Bennett is a 82 y.o. male with medical history significant of atrial fibrillation and dementia. 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 He was also unable to take a deep breath without pain. He went to see his PCP who sent him to the ER to r/o PE. He has chest pain and shortness of breath that started in the AM of 5/8. No recent car trips or flight. No increased pain or swelling in LE per patient. Patient has dementia and is slow to respond to questions. He has several entries in EPIC from a [facility] social worker as well an open APS case per notes.    Assessment & Plan:   Active Problems:   Dementia   Chronic atrial fibrillation (HCC)   Pulmonary embolus (HCC)   Acute saddle PE with pulm infarct -echo pending -heparin gtt -SDU for close monitoring -duplex LE negative  Dementia -meds at home -appe[place] to [place] -called [facility] social worker as apparent APS case  Chronic a fib -rate controlled -on no BP agent nor anticoagulation -? Fall risk -PT consult  Old compression fractures  H/o prostate cancer   DVT prophylaxis:  Heparin gtt  Code Status: full  Family Communication: No family  Disposition Plan:  Social work consult for disposition   Consultants:   Social work  Procedures:     Antimicrobials:      Subjective: Confused, does not remember our conversations yesterday  Objective: Filed Vitals:   08/07/15 0415 08/07/15 0417 08/07/15 0735 08/07/15 1208  BP:  140/84  131/68  Pulse:  83  98  Temp:  99.3 F (37.4 C) 98.4 F (36.9 C) 98 F (36.7 C)  TempSrc:   Oral Oral  Resp:  23  18  Height:      Weight: 74.5 kg (164 lb 3.9 oz)     SpO2:  95%  99%    Intake/Output Summary (Last 24 hours) at 08/07/15 1218 Last data filed at 08/07/15 0400  Gross per 24 hour    Intake 710.17 ml  Output    175 ml  Net 535.17 ml   Filed Weights   08/06/15 1800 08/06/15 2146 08/07/15 0415  Weight: 76.204 kg (168 lb) 73.8 kg (162 lb 11.2 oz) 74.5 kg (164 lb 3.9 oz)    Examination:  General exam: confused Respiratory system: Clear to auscultation. Respiratory effort normal. Cardiovascular system: irregular. Gastrointestinal system: Abdomen is nondistended, soft and nontender. No organomegaly or masses felt. Normal bowel sounds heard. Central nervous system: Alert and oriented. No focal neurological deficits. Extremities: Symmetric 5 x 5 power.     Data Reviewed: I have personally reviewed following labs and imaging studies  CBC:  Recent Labs Lab 08/06/15 1530 08/07/15 0512  WBC 11.3* 9.9  NEUTROABS 9.7*  --   HGB 15.0 13.1  HCT 44.7 39.4  MCV 96.5 96.3  PLT 200 193   Basic Metabolic Panel:  Recent Labs Lab 08/06/15 1530 08/07/15 0512  NA 138 137  K 4.4 3.9  CL 103 104  CO2 24 23  GLUCOSE 110* 108*  BUN 15 12  CREATININE 1.08 0.89  CALCIUM 9.9 9.1   GFR: Estimated Creatinine Clearance: 66.3 mL/min (by C-G formula based on Cr of 0.89). Liver Function Tests:  Recent Labs Lab 08/06/15 1530  AST  20  ALT 12*  ALKPHOS 76  BILITOT 2.1*  PROT 7.3  ALBUMIN 3.7   No results for input(s): LIPASE, AMYLASE in the last 168 hours. No results for input(s): AMMONIA in the last 168 hours. Coagulation Profile:  Recent Labs Lab 08/06/15 1530  INR 1.20   Cardiac Enzymes: No results for input(s): CKTOTAL, CKMB, CKMBINDEX, TROPONINI in the last 168 hours. BNP (last 3 results) No results for input(s): PROBNP in the last 8760 hours. HbA1C: No results for input(s): HGBA1C in the last 72 hours. CBG: No results for input(s): GLUCAP in the last 168 hours. Lipid Profile: No results for input(s): CHOL, HDL, LDLCALC, TRIG, CHOLHDL, LDLDIRECT in the last 72 hours. Thyroid Function Tests: No results for input(s): TSH, T4TOTAL, FREET4, T3FREE,  THYROIDAB in the last 72 hours. Anemia Panel: No results for input(s): VITAMINB12, FOLATE, FERRITIN, TIBC, IRON, RETICCTPCT in the last 72 hours. Urine analysis:    Component Value Date/Time   COLORURINE YELLOW 10/23/2014 1829   APPEARANCEUR CLEAR 10/23/2014 1829   LABSPEC 1.015 10/23/2014 1829   PHURINE 6.0 10/23/2014 1829   GLUCOSEU NEGATIVE 10/23/2014 1829   HGBUR NEGATIVE 10/23/2014 1829   BILIRUBINUR NEGATIVE 10/23/2014 1829   KETONESUR NEGATIVE 10/23/2014 1829   PROTEINUR NEGATIVE 10/23/2014 1829   UROBILINOGEN 1.0 10/23/2014 1829   NITRITE NEGATIVE 10/23/2014 1829   LEUKOCYTESUR NEGATIVE 10/23/2014 1829    Recent Results (from the past 240 hour(s))  MRSA PCR Screening     Status: None   Collection Time: 08/06/15 10:00 PM  Result Value Ref Range Status   MRSA by PCR NEGATIVE NEGATIVE Final    Comment:        The GeneXpert MRSA Assay (FDA approved for NASAL specimens only), is one component of a comprehensive MRSA colonization surveillance program. It is not intended to diagnose MRSA infection nor to guide or monitor treatment for MRSA infections.       Anti-infectives    None       Radiology Studies: Dg Chest 2 View  08/06/2015  CLINICAL DATA:  Shortness of breath and chest pain. Severe pain with breathing. EXAM: CHEST - 2 VIEW COMPARISON:  One-view chest x-ray 10/23/2014. FINDINGS: The heart is enlarged. Atherosclerotic calcifications are present at the aortic arch. Bilateral lower lobe airspace disease is present. The costophrenic angle is incompletely imaged. Small effusions are suspected. The lung volumes are low. The upper lung in foot are clear. The visualized soft tissues and bony thorax are unremarkable. IMPRESSION: 1. Bilateral lower lobe airspace disease compatible with pneumonia. 2. Bilateral pleural effusions. 3. Low lung volumes. Electronically Signed   By: Marin Roberts M.D.   On: 08/06/2015 15:44   Ct Angio Chest Pe W/cm &/or Wo  Cm  08/06/2015  CLINICAL DATA:  Shortness of breath while lying flat. Elevated D-dimer. EXAM: CT ANGIOGRAPHY CHEST CT ABDOMEN AND PELVIS WITH CONTRAST TECHNIQUE: Multidetector CT imaging of the chest was performed using the standard protocol during bolus administration of intravenous contrast. Multiplanar CT image reconstructions and MIPs were obtained to evaluate the vascular anatomy. Multidetector CT imaging of the abdomen and pelvis was performed using the standard protocol during bolus administration of intravenous contrast. CONTRAST:  100 cc of Isovue 370 COMPARISON:  None. FINDINGS: CTA CHEST FINDINGS Mediastinum: There is a large saddle embolus involving the right and left pulmonary arteries. There are also bilateral segmental pulmonary artery filling defects as well as subsegmental filling defects. The trachea appears patent and is midline. Normal appearance of the esophagus. There  is aortic atherosclerosis noted. LAD coronary artery and left circumflex coronary artery filling defects are identified. Prominent right hilar lymph node measures 9 mm. Lungs/Pleura: Small right pleural effusion is identified. Atelectasis and airspace consolidation within the lung bases noted right greater than left compatible with pulmonary infarct. In the right upper low there is a 4 x 2 mm nodule (3 mm mean diameter) on image 26 of series 507. Musculoskeletal: Mild spondylosis is noted within the thoracic spine. No aggressive lytic or sclerotic bone lesions. Compression fracture within the lower thoracic spine is identified at the T9 level. This is age indeterminate with loss of 50% of the vertebral body height, image 100 of series 503. There is also a fracture deformity involving the mid body of sternum. This appears chronic and healed. CT ABDOMEN and PELVIS FINDINGS Hepatobiliary: Multiple fluid attenuating structures are identified within the liver compatible with cysts. There is a enhancing lesion within the posterior right  lobe of liver which likely represents a hemangioma. This measures 2.5 cm, image 29 of series 601. The gallbladder is normal. No biliary dilatation. Pancreas: Normal appearance of the pancreas. Spleen: Negative Adrenals/Urinary Tract: The adrenal glands are both normal. Unremarkable appearance of both kidneys. Urinary bladder appears normal. Stomach/Bowel: The stomach is within normal limits. The small bowel loops have a normal course and caliber. No obstruction. Normal appearance of the colon. No pathologic dilatation of the small or large bowel loops identified Vascular/Lymphatic: Calcified atherosclerotic disease involves the abdominal aorta. No aneurysm. No enlarged retroperitoneal or mesenteric adenopathy. No enlarged pelvic or inguinal lymph nodes. Reproductive: Seed implants are noted within the prostate gland. Other: There is no ascites or focal fluid collections within the abdomen or pelvis. Previous mesh repair of ventral abdominal wall hernia. Musculoskeletal: The bones appear osteopenic. There is a scoliosis deformity involving the lumbar spine which is convex towards the left. Compression fractures involve T9, L1, L2, L3 and L4. L5-S1 degenerative disc disease noted. Review of the MIP images confirms the above findings. IMPRESSION: 1. Examination is positive for large volume pulmonary embolus including saddle embolus. Critical Value/emergent results were called by telephone at the time of interpretation on 08/06/2015 at 6:12 pm to Dr. Chaney Malling , who verbally acknowledged these results. 2. Airspace consolidation within the lung bases likely reflecting pulmonary infarct. 3. No evidence for bowel obstruction. 4. Liver cysts and hemangioma 5. Aortic atherosclerosis 6. Compression fractures are noted involving the T9, L1, L2, L3, L4 vertebra. These are all age indeterminate. There are is also an old sternal fracture. Electronically Signed   By: Signa Kell M.D.   On: 08/06/2015 18:13   Ct Abdomen Pelvis W  Contrast  08/06/2015  CLINICAL DATA:  Shortness of breath while lying flat. Elevated D-dimer. EXAM: CT ANGIOGRAPHY CHEST CT ABDOMEN AND PELVIS WITH CONTRAST TECHNIQUE: Multidetector CT imaging of the chest was performed using the standard protocol during bolus administration of intravenous contrast. Multiplanar CT image reconstructions and MIPs were obtained to evaluate the vascular anatomy. Multidetector CT imaging of the abdomen and pelvis was performed using the standard protocol during bolus administration of intravenous contrast. CONTRAST:  100 cc of Isovue 370 COMPARISON:  None. FINDINGS: CTA CHEST FINDINGS Mediastinum: There is a large saddle embolus involving the right and left pulmonary arteries. There are also bilateral segmental pulmonary artery filling defects as well as subsegmental filling defects. The trachea appears patent and is midline. Normal appearance of the esophagus. There is aortic atherosclerosis noted. LAD coronary artery and left circumflex coronary  artery filling defects are identified. Prominent right hilar lymph node measures 9 mm. Lungs/Pleura: Small right pleural effusion is identified. Atelectasis and airspace consolidation within the lung bases noted right greater than left compatible with pulmonary infarct. In the right upper low there is a 4 x 2 mm nodule (3 mm mean diameter) on image 26 of series 507. Musculoskeletal: Mild spondylosis is noted within the thoracic spine. No aggressive lytic or sclerotic bone lesions. Compression fracture within the lower thoracic spine is identified at the T9 level. This is age indeterminate with loss of 50% of the vertebral body height, image 100 of series 503. There is also a fracture deformity involving the mid body of sternum. This appears chronic and healed. CT ABDOMEN and PELVIS FINDINGS Hepatobiliary: Multiple fluid attenuating structures are identified within the liver compatible with cysts. There is a enhancing lesion within the posterior  right lobe of liver which likely represents a hemangioma. This measures 2.5 cm, image 29 of series 601. The gallbladder is normal. No biliary dilatation. Pancreas: Normal appearance of the pancreas. Spleen: Negative Adrenals/Urinary Tract: The adrenal glands are both normal. Unremarkable appearance of both kidneys. Urinary bladder appears normal. Stomach/Bowel: The stomach is within normal limits. The small bowel loops have a normal course and caliber. No obstruction. Normal appearance of the colon. No pathologic dilatation of the small or large bowel loops identified Vascular/Lymphatic: Calcified atherosclerotic disease involves the abdominal aorta. No aneurysm. No enlarged retroperitoneal or mesenteric adenopathy. No enlarged pelvic or inguinal lymph nodes. Reproductive: Seed implants are noted within the prostate gland. Other: There is no ascites or focal fluid collections within the abdomen or pelvis. Previous mesh repair of ventral abdominal wall hernia. Musculoskeletal: The bones appear osteopenic. There is a scoliosis deformity involving the lumbar spine which is convex towards the left. Compression fractures involve T9, L1, L2, L3 and L4. L5-S1 degenerative disc disease noted. Review of the MIP images confirms the above findings. IMPRESSION: 1. Examination is positive for large volume pulmonary embolus including saddle embolus. Critical Value/emergent results were called by telephone at the time of interpretation on 08/06/2015 at 6:12 pm to Dr. Chaney MallingAVID YAO , who verbally acknowledged these results. 2. Airspace consolidation within the lung bases likely reflecting pulmonary infarct. 3. No evidence for bowel obstruction. 4. Liver cysts and hemangioma 5. Aortic atherosclerosis 6. Compression fractures are noted involving the T9, L1, L2, L3, L4 vertebra. These are all age indeterminate. There are is also an old sternal fracture. Electronically Signed   By: Signa Kellaylor  Stroud M.D.   On: 08/06/2015 18:13         Scheduled Meds: . aspirin EC  81 mg Oral QHS  . memantine  5 mg Oral Daily  . multivitamin with minerals  1 tablet Oral QHS  . sodium chloride flush  3 mL Intravenous Q12H   Continuous Infusions: . heparin 1,300 Units/hr (08/07/15 1109)     LOS: 1 day    Time spent: 25 min    Takoda Siedlecki U Caelie Remsburg, DO [facility] Pager [phone]  If 7PM-7AM, please contact night-coverage [email or web address] Password [email or web address] 08/07/2015, 12:18 PM

## 2015-08-07 NOTE — Care Management Note (Signed)
Case Management Note  Patient Details  Name: Seward MethJames D Wahid MRN: [id number] Date of Birth: 06/03/1929  Subjective/Objective:  Pt admitted for Pulmonary Embolus. Pt is from home. CSW following the case due to APS Referral.                   Action/Plan: PT/ OT to be consulted.  CM will continue to monitor for additional needs.    Expected Discharge Date:                  Expected Discharge Plan:  Skilled Nursing Facility  In-House Referral:  Clinical Social Work  Discharge planning Services  CM Consult  Post Acute Care Choice:    Choice offered to:     DME Arranged:    DME Agency:     HH Arranged:    HH Agency:     Status of Service:  In process, will continue to follow  Medicare Important Message Given:    Date Medicare IM Given:    Medicare IM give by:    Date Additional Medicare IM Given:    Additional Medicare Important Message give by:     If discussed at Long Length of Stay Meetings, dates discussed:    Additional Comments:  Gala LewandowskyGraves-Bigelow, Aldridge Krzyzanowski Kaye, RN 08/07/2015, 3:31 PM

## 2015-08-07 NOTE — Patient Outreach (Addendum)
[facility] Care Management  08/07/2015  Seward MethJames D Bauman 08/10/1929 [id number]   Assessment-CSW received voice message yesterday evening from inpatient physician Marlin CanaryJessica Vann stating that patient has been admitted into hospital and provided medical updates on patient. Physician states that an inpatient social worker will be in touch. They are aware of APS case. CSW will continue to monitor case and wait for hospital discharge.  Plan-CSW will update RNCM.  [name] Hudsyn Champine, BSW, MSW, LCSW [facility] Care Management [email or web address] Phone: [phone] Fax: [phone]

## 2015-08-07 NOTE — Progress Notes (Signed)
  Echocardiogram 2D Echocardiogram has been performed.  Nolon RodBrown, Tony 08/07/2015, 12:21 PM

## 2015-08-07 NOTE — Evaluation (Signed)
Physical Therapy Evaluation Patient Details Name: Craig Bennett MRN: [id number] DOB: 1929/12/30 Today's Date: 08/07/2015   History of Present Illness  82 y.o. male admitted to [facility] on 08/06/15 for SOB and found to have + PE.  Pt with significant PMHx of A-fib, syncope, dementia, CKD, CHF, COPD, and renal insufficiency.    Clinical Impression  Pt was able to get up and move around the room.  He did demonstrate the need for hands for stability while moving around.  He admits to being a furniture walker at baseline. He would benefit from max [facility] services and a cane at discharge.   PT to follow acutely for deficits listed below.       Follow Up Recommendations Home health PT    Equipment Recommendations  Cane    Recommendations for Other Services   NA    Precautions / Restrictions Precautions Precautions: Fall Precaution Comments: mildly unsteady on his feet      Mobility  Bed Mobility Overal bed mobility: Needs Assistance Bed Mobility: Supine to Sit     Supine to sit: Min assist     General bed mobility comments: Min hand held assist to pull to sitting EOB.   Transfers Overall transfer level: Needs assistance Equipment used: None Transfers: Sit to/from Stand Sit to Stand: Min guard         General transfer comment: Min guard assist to steady pt for balance.   Ambulation/Gait Ambulation/Gait assistance: Min guard Ambulation Distance (Feet): 20 Feet Assistive device:  (both hands on IV pole) Gait Pattern/deviations: Step-through pattern;Staggering left;Staggering right Gait velocity: decreased   General Gait Details: Pt with reliance on hands on stability surfaces, admitted to being a furniture walker PTA         Balance Overall balance assessment: Needs assistance Sitting-balance support: Feet supported;No upper extremity supported Sitting balance-Leahy Scale: Good     Standing balance support: Single extremity supported;Bilateral upper extremity supported;No  upper extremity supported Standing balance-Leahy Scale: Poor Standing balance comment: needs external support in standing.                              Pertinent Vitals/Pain Pain Assessment: No/denies pain    Home Living Family/patient expects to be discharged to:: Private residence Living Arrangements: Alone Available Help at Discharge: Other (Comment) (none, brother lives in [place], [place]) Type of Home: Apartment Home Access: Level entry;Elevator     Home Layout: One level Home Equipment: None      Prior Function Level of Independence: Independent         Comments: Per pt report he gets meals on wheels 1 meal a day and eats cereal for breakfast.  He tries to stay active by walking around his apartment complex.  He does admitt to being a bit of a furniture walker.         Extremity/Trunk Assessment   Upper Extremity Assessment: Overall WFL for tasks assessed           Lower Extremity Assessment: Generalized weakness      Cervical / Trunk Assessment: Normal  Communication   Communication: No difficulties  Cognition Arousal/Alertness: Awake/alert Behavior During Therapy: WFL for tasks assessed/performed Overall Cognitive Status: Impaired/Different from baseline Area of Impairment: Problem solving             Problem Solving: Slow processing General Comments: Pt seems to be slow to process at times.  Therapist has to repeat questions and allow  extra time.  Most answers seem accurate once he gets them out.              Assessment/Plan    PT Assessment Patient needs continued PT services  PT Diagnosis Difficulty walking;Abnormality of gait;Generalized weakness;Altered mental status   PT Problem List Decreased strength;Decreased activity tolerance;Decreased balance;Decreased mobility;Decreased cognition;Decreased knowledge of use of DME;Decreased safety awareness;Cardiopulmonary status limiting activity  PT Treatment Interventions DME  instruction;Gait training;Functional mobility training;Therapeutic activities;Therapeutic exercise;Neuromuscular re-education;Balance training;Cognitive remediation;Patient/family education   PT Goals (Current goals can be found in the Care Plan section) Acute Rehab PT Goals Patient Stated Goal: to go home PT Goal Formulation: With patient Time For Goal Achievement: 08/21/15 Potential to Achieve Goals: Good    Frequency Min 3X/week   Barriers to discharge Decreased caregiver support pt really has no support at discharge.       End of Session Equipment Utilized During Treatment: Gait belt Activity Tolerance: Patient tolerated treatment well Patient left: in chair;with call bell/phone within reach;with chair alarm set           Time: 1113-1140 PT Time Calculation (min) (ACUTE ONLY): 27 min   Charges:   PT Evaluation $PT Eval Moderate Complexity: 1 Procedure PT Treatments $Therapeutic Activity: 8-22 mins        Allison Silva B. Govani Radloff, PT, DPT [phone]   08/07/2015, 11:59 AM

## 2015-08-07 NOTE — Patient Outreach (Signed)
[facility] Care Management  08/07/2015  Seward MethJames D Dikes 01/27/1930 [id number]   Assessment-CSW received incoming call from close friend Darla LeschesJim Plyler who had questions about patient completing an advance directive. [name] was unaware of hospitalization. Friend plans to see him this afternoon. CSW contacted APS in order to provide updates and gain updates as well. CSW unable to reach anyone and left a HIPPA compliant voice message.  Plan-CSW will await to hear back from APS social worker.  [name] Charlcie Prisco, BSW, MSW, LCSW [facility] Care [facility] [email or web address] Phone: [phone] Fax: [phone]

## 2015-08-07 NOTE — Progress Notes (Signed)
ANTICOAGULATION CONSULT NOTE - Follow Up Consult  Pharmacy Consult for heparin Indication: pulmonary embolus  Labs:  Recent Labs  08/06/15 1530 08/07/15 0512  HGB 15.0 13.1  HCT 44.7 39.4  PLT 200 193  LABPROT 15.4*  --   INR 1.20  --   HEPARINUNFRC  --  0.46  CREATININE 1.08  --      Assessment/Plan:  82yo male therapeutic on heparin with initial dosing for PE. Will continue gtt at current rate and confirm stable with additional level.   Vernard GamblesVeronda Melina Mosteller, PharmD, BCPS  08/07/2015,6:09 AM

## 2015-08-08 ENCOUNTER — Other Ambulatory Visit: Payer: Self-pay | Admitting: Licensed Clinical Social Worker

## 2015-08-08 ENCOUNTER — Encounter (HOSPITAL_COMMUNITY): Payer: Self-pay

## 2015-08-08 DIAGNOSIS — I482 Chronic atrial fibrillation: Secondary | ICD-10-CM

## 2015-08-08 DIAGNOSIS — F0390 Unspecified dementia without behavioral disturbance: Secondary | ICD-10-CM

## 2015-08-08 DIAGNOSIS — I2692 Saddle embolus of pulmonary artery without acute cor pulmonale: Principal | ICD-10-CM

## 2015-08-08 DIAGNOSIS — J181 Lobar pneumonia, unspecified organism: Secondary | ICD-10-CM

## 2015-08-08 LAB — CBC
HCT: 38.5 % — ABNORMAL LOW (ref 39.0–52.0)
Hemoglobin: 12.9 g/dL — ABNORMAL LOW (ref 13.0–17.0)
MCH: 32.2 pg (ref 26.0–34.0)
MCHC: 33.5 g/dL (ref 30.0–36.0)
MCV: 96 fL (ref 78.0–100.0)
PLATELETS: 188 10*3/uL (ref 150–400)
RBC: 4.01 MIL/uL — AB (ref 4.22–5.81)
RDW: 12.7 % (ref 11.5–15.5)
WBC: 8.8 10*3/uL (ref 4.0–10.5)

## 2015-08-08 LAB — BASIC METABOLIC PANEL
ANION GAP: 10 (ref 5–15)
BUN: 13 mg/dL (ref 6–20)
CALCIUM: 9.2 mg/dL (ref 8.9–10.3)
CO2: 24 mmol/L (ref 22–32)
Chloride: 105 mmol/L (ref 101–111)
Creatinine, Ser: 0.91 mg/dL (ref 0.61–1.24)
GFR calc Af Amer: >60 mL/min (ref >60)
GFR calc non Af Amer: >60 mL/min (ref >60)
Glucose, Bld: 110 mg/dL — ABNORMAL HIGH (ref 65–99)
POTASSIUM: 3.8 mmol/L (ref 3.5–5.1)
SODIUM: 139 mmol/L (ref 135–145)

## 2015-08-08 LAB — STREP PNEUMONIAE URINARY ANTIGEN: STREP PNEUMO URINARY ANTIGEN: NEGATIVE

## 2015-08-08 LAB — HEPARIN LEVEL (UNFRACTIONATED): HEPARIN UNFRACTIONATED: 0.34 [IU]/mL (ref 0.30–0.70)

## 2015-08-08 MED ORDER — LEVOFLOXACIN IN D5W 750 MG/150ML IV SOLN
750.0000 mg | INTRAVENOUS | Status: DC
  Administered 2015-08-08 – 2015-08-10 (×3): 750 mg via INTRAVENOUS
  Filled 2015-08-08 (×3): qty 150

## 2015-08-08 MED ORDER — CETYLPYRIDINIUM CHLORIDE 0.05 % MT LIQD
7.0000 mL | Freq: Two times a day (BID) | OROMUCOSAL | Status: DC
  Administered 2015-08-08 – 2015-08-10 (×5): 7 mL via OROMUCOSAL

## 2015-08-08 MED ORDER — HEPARIN (PORCINE) IN NACL 100-0.45 UNIT/ML-% IJ SOLN
1350.0000 [IU]/h | INTRAMUSCULAR | Status: DC
  Administered 2015-08-09: 1350 [IU]/h via INTRAVENOUS
  Filled 2015-08-08: qty 250

## 2015-08-08 NOTE — Clinical Social Work Note (Signed)
Clinical Social Work Assessment  Patient Details  Name: Craig Bennett MRN: [id number] Date of Birth: 01/24/1930  Date of referral:  08/08/15               Reason for consult:  Discharge Planning, Facility Placement                Permission sought to share information with:  Facility Sport and exercise psychologist, Family Supports Permission granted to share information::  Yes, Verbal Permission Granted  Name::     Scientist, clinical (histocompatibility and immunogenetics)::  SNFs  Relationship::     Contact Information:     Housing/Transportation Living arrangements for the past 2 months:  Apartment Source of Information:  Patient Patient Interpreter Needed:  None Criminal Activity/Legal Involvement Pertinent to Current Situation/Hospitalization:  No - Comment as needed Significant Relationships:  Friend Lives with:  Self Do you feel safe going back to the place where you live?  Yes Need for family participation in patient care:  Yes (Comment)  Care giving concerns:  The patient does not report any concerns. Of note, there is an open APS report on patient.   Social Worker assessment / plan:  CSW met with the patient at bedside to complete assessment. The patient states that he lives at home alone. CSW tested patient's orientation. He is able to state that he is at [facility], he states the year is "31", and he states that our president is Trump. The patient states that he is open to SNF placement at discharge. He reports that is supports are very limited and that Craig Bennett (friend) is the main person that helps him with things. The patient states that he is agreeable to Craig Bennett being involved in the discharge planning process. CSW explained the SNF search/placement process and answered the patient's questions.   CSW spoke with the patient's [facility] social worker Craig Bennett. Craig Bennett has been assisting the patient in the community but has faced many obstacles in getting the patient services that he needs. She shares that the patient's daughter is  in [place] and is not really involved with the patient. She does report that an APS report has been made but the results of the case have not been received. If we are not able to get a response from APS before DC we will need to make the receiving facility aware of the open case. CSW will followup with bed offers.    Employment status:  Retired, Disabled (Comment on whether or not currently receiving Disability) Insurance information:  Programmer, applications PT Recommendations:  Home with [place] / Referral to community resources:  [place]  Patient/Family's Response to care:  The patient appears happy with the care he is receiving and is appreciative of CSW assistance.  Patient/Family's Understanding of and Emotional Response to Diagnosis, Current Treatment, and Prognosis:  The patient appears to have limited insight into reason for admission but he does seem to agree that he should not return home at time of discharge.   Emotional Assessment Appearance:  Appears stated age Attitude/Demeanor/Rapport:  Other (The patient is appropriate and welcoming of CSW. He responds appropriately, but responses are delayed. ) Affect (typically observed):  Accepting, Appropriate, Calm, Pleasant Orientation:  Oriented to Place, Oriented to Self, Oriented to  Time, Oriented to Situation Alcohol / Substance use:  Not Applicable Psych involvement (Current and /or in the community):  No (Comment)  Discharge Needs  Concerns to be addressed:  Discharge Planning Concerns Readmission within the last  30 days:    Current discharge risk:  Chronically ill, Physical Impairment Barriers to Discharge:  Continued Medical Work up   Craig Bennett 08/08/2015, 12:44 PM

## 2015-08-08 NOTE — Progress Notes (Signed)
ANTICOAGULATION CONSULT NOTE - Follow Up Consult  Pharmacy Consult for Heparin Indication: pulmonary embolus  No Known Allergies  Patient Measurements: Height: 6' (182.9 cm) Weight: 165 lb 14.4 oz (75.252 kg) IBW/kg (Calculated) : 77.6  Vital Signs: Temp: 98 F (36.7 C) (05/10 0746) Temp Source: Oral (05/10 0746) BP: 145/76 mmHg (05/10 0444) Pulse Rate: 88 (05/10 0444)  Labs:  Recent Labs  08/06/15 1530 08/07/15 0512 08/07/15 1238 08/08/15 0528 08/08/15 0532 08/08/15 0534  HGB 15.0 13.1  --   --  12.9*  --   HCT 44.7 39.4  --   --  38.5*  --   PLT 200 193  --   --  188  --   LABPROT 15.4*  --   --   --   --   --   INR 1.20  --   --   --   --   --   HEPARINUNFRC  --  0.46 0.48  --   --  0.34  CREATININE 1.08 0.89  --  0.91  --   --     Estimated Creatinine Clearance: 65.5 mL/min (by C-G formula based on Cr of 0.91).   Medications:  Heparin @ 1300 units/hr  Assessment: 85yom with hx of Afib, not on anticoagulation PTA (CHA2DS2VASc = 3, prev on coumadin but d/c'd ~ 07/2014), now with a new saddle PE (per CT angio 5/8). LE dopplers negative for DVT. He continues on IV heparin. Heparin level is therapeutic on the low end at 0.34. CBC stable. No bleeding.  Goal of Therapy:  Heparin level 0.3-0.7 units/ml Monitor platelets by anticoagulation protocol: Yes   Plan:  1) Increase heparin to 1350 units/hr to maintain therapeutic 2) Daily heparin level and CBC 3) Follow up oral anticoagulation  Fredrik RiggerMarkle, Bode Pieper Sue 08/08/2015,8:32 AM

## 2015-08-08 NOTE — Progress Notes (Addendum)
Patient ID: Craig Bennett, male   DOB: 06/08/1929, 82 y.o.   MRN: [id number]  PROGRESS NOTE    Craig Bennett  WUJ:[id number] DOB: 05/06/1929 DOA: 08/06/2015  PCP: Thayer Headings, MD   Brief Narrative:  82 y.o. male with medical history significant of atrial fibrillation and dementia. Patient presented to [facility] 08/06/2015 with difficulty breathing, pleuritic chest pain. Patient saw his primary care physician who sent him to ER for further evaluation. Patient was subsequently found to have large saddle pulmonary embolism and he was started on anticoagulation with heparin. Hospital course complicated with pneumonia. Patient started on Levaquin 08/08/2015.   Assessment & Plan:   Active Problems: Pleuritic chest pain / Acute large saddle pulmonary embolism  - CT angio chest showed large saddle pulmonary embolism - On Heparin drip - Lower extremity Doppler negative for DVT - Will switch to oral AC in next 24 hours provided no bleeding while on heparin drip  Dementia without behavioral disturbance  - Continue memantine   Chronic atrial fibrillation - CHADS vasc score 2 - On AC with aspirin prior to admission - Now on heparin drip - Rate controlled at this point so not on any beta blocker but since his BP 145/76 we will start low dose atenolol   Bilateral lower lobe pneumonia, unspecified organism / Leukocytosis - Bilateral lower lobe pneumonia detected on CXR - In addition, pt spiked fever of 100.5 F in past 24 hours - Pneumonia order set utilized, 5/10 - F/U blood and resp culture results - F/U strep pneumonia, legionella results - Started Levaquin per pharmacy    DVT prophylaxis: on Heparin drip  Code Status: full code  Family Communication: no family at the bedside this am  Disposition Plan: home likely by 08/10/2015; pt on full dose AC, needs to be switched to oral AC provided no bleeding.   Consultants:   None   Procedures:   2-D ECHO 08/07/2015 - ejection  fraction 55%  Vascular lower extremity Doppler 08/07/2015 - no evidence of DVT in the right or left lower extremity  Antimicrobials:   Levaquin 08/08/2015 -->    Subjective: No overnight events.  Objective: Filed Vitals:   08/07/15 2055 08/08/15 0013 08/08/15 0444 08/08/15 0746  BP: 153/96 138/87 145/76   Pulse: 91 80 88   Temp: 100.5 F (38.1 C) 99.7 F (37.6 C) 100.5 F (38.1 C) 98 F (36.7 C)  TempSrc: Oral Oral Oral Oral  Resp: 18 22 19    Height:      Weight:   75.252 kg (165 lb 14.4 oz)   SpO2: 94% 93% 91%     Intake/Output Summary (Last 24 hours) at 08/08/15 1053 Last data filed at 08/08/15 0900  Gross per 24 hour  Intake 1124.71 ml  Output   1500 ml  Net -375.29 ml   Filed Weights   08/06/15 2146 08/07/15 0415 08/08/15 0444  Weight: 73.8 kg (162 lb 11.2 oz) 74.5 kg (164 lb 3.9 oz) 75.252 kg (165 lb 14.4 oz)    Examination:  General exam: Appears calm and comfortable  Respiratory system: Clear to auscultation. Respiratory effort normal. Cardiovascular system: S1 & S2 heard, Rate controlled Gastrointestinal system: (+) BS< non tender, non distended abdomen  Central nervous system: Alert and oriented. No focal neurological deficits. Extremities: Symmetric 5 x 5 power. Skin: No rashes, lesions or ulcers Psychiatry: Judgement and insight appear normal. Mood & affect appropriate.   Data Reviewed: I have personally reviewed following labs and imaging studies  CBC:  Recent Labs Lab 08/06/15 1530 08/07/15 0512 08/08/15 0532  WBC 11.3* 9.9 8.8  NEUTROABS 9.7*  --   --   HGB 15.0 13.1 12.9*  HCT 44.7 39.4 38.5*  MCV 96.5 96.3 96.0  PLT 200 193 188   Basic Metabolic Panel:  Recent Labs Lab 08/06/15 1530 08/07/15 0512 08/08/15 0528  NA 138 137 139  K 4.4 3.9 3.8  CL 103 104 105  CO2 24 23 24   GLUCOSE 110* 108* 110*  BUN 15 12 13   CREATININE 1.08 0.89 0.91  CALCIUM 9.9 9.1 9.2   GFR: Estimated Creatinine Clearance: 65.5 mL/min (by C-G  formula based on Cr of 0.91). Liver Function Tests:  Recent Labs Lab 08/06/15 1530  AST 20  ALT 12*  ALKPHOS 76  BILITOT 2.1*  PROT 7.3  ALBUMIN 3.7   No results for input(s): LIPASE, AMYLASE in the last 168 hours. No results for input(s): AMMONIA in the last 168 hours. Coagulation Profile:  Recent Labs Lab 08/06/15 1530  INR 1.20   Cardiac Enzymes: No results for input(s): CKTOTAL, CKMB, CKMBINDEX, TROPONINI in the last 168 hours. BNP (last 3 results) No results for input(s): PROBNP in the last 8760 hours. HbA1C: No results for input(s): HGBA1C in the last 72 hours. CBG: No results for input(s): GLUCAP in the last 168 hours. Lipid Profile: No results for input(s): CHOL, HDL, LDLCALC, TRIG, CHOLHDL, LDLDIRECT in the last 72 hours. Thyroid Function Tests: No results for input(s): TSH, T4TOTAL, FREET4, T3FREE, THYROIDAB in the last 72 hours. Anemia Panel: No results for input(s): VITAMINB12, FOLATE, FERRITIN, TIBC, IRON, RETICCTPCT in the last 72 hours. Urine analysis:    Component Value Date/Time   COLORURINE YELLOW 10/23/2014 1829   APPEARANCEUR CLEAR 10/23/2014 1829   LABSPEC 1.015 10/23/2014 1829   PHURINE 6.0 10/23/2014 1829   GLUCOSEU NEGATIVE 10/23/2014 1829   HGBUR NEGATIVE 10/23/2014 1829   BILIRUBINUR NEGATIVE 10/23/2014 1829   KETONESUR NEGATIVE 10/23/2014 1829   PROTEINUR NEGATIVE 10/23/2014 1829   UROBILINOGEN 1.0 10/23/2014 1829   NITRITE NEGATIVE 10/23/2014 1829   LEUKOCYTESUR NEGATIVE 10/23/2014 1829   Sepsis Labs: @LABRCNTIP (procalcitonin:4,lacticidven:4)    Recent Results (from the past 240 hour(s))  MRSA PCR Screening     Status: None   Collection Time: 08/06/15 10:00 PM  Result Value Ref Range Status   MRSA by PCR NEGATIVE NEGATIVE Final    Comment:        The GeneXpert MRSA Assay (FDA approved for NASAL specimens only), is one component of a comprehensive MRSA colonization surveillance program. It is not intended to diagnose  MRSA infection nor to guide or monitor treatment for MRSA infections.       Radiology Studies: Dg Chest 2 View 08/06/2015 1. Bilateral lower lobe airspace disease compatible with pneumonia. 2. Bilateral pleural effusions. 3. Low lung volumes.   Ct Angio Chest Pe W/cm &/or Wo Cm 08/06/2015  1. Examination is positive for large volume pulmonary embolus including saddle embolus.   Ct Abdomen Pelvis W Contrast 08/06/2015 1. Examination is positive for large volume pulmonary embolus including saddle embolus.    Scheduled Meds: . antiseptic oral rinse  7 mL Mouth Rinse BID  . aspirin EC  81 mg Oral QHS  . memantine  5 mg Oral Daily  . multivitamin with minerals  1 tablet Oral QHS  . sodium chloride flush  3 mL Intravenous Q12H   Continuous Infusions: . heparin 1,350 Units/hr (08/08/15 1049)     LOS: 2 days  Time spent: 25 minutes  Greater than 50% of the time spent on counseling and coordinating the care.   Manson Passey, MD [facility] Pager [phone]  If 7PM-7AM, please contact night-coverage [email or web address] Password [email or web address] 08/08/2015, 10:53 AM

## 2015-08-08 NOTE — Patient Outreach (Signed)
[facility]) Care Management  08/08/2015  Craig MethJames D Bennett 01/04/1930 [id number]   Assessment-CSW received incoming call from inpatient LCSW and provided updates on patient. CSW has still not heard back from APS. CSW contacted APS again today to Craig Bennett at [phone] and left a HIPPA compliant voice message. Plan for patient is for SNF placement.  Plan-CSW will follow case closely and will await for SNF placement.  Craig Bennett, BSW, MSW, LCSW [facility] Care Management [email or web address] Phone: [phone] Fax: [phone]

## 2015-08-08 NOTE — Clinical Social Work Placement (Signed)
   CLINICAL SOCIAL WORK PLACEMENT  NOTE  Date:  08/08/2015  Patient Details  Name: Craig Bennett MRN: [id number] Date of Birth: 03/15/1930  Clinical Social Work is seeking post-discharge placement for this patient at the Skilled  Nursing Facility level of care (*CSW will initial, date and re-position this form in  chart as items are completed):  Yes   Patient/family provided with [place] Clinical Social Work Department's list of facilities offering this level of care within the geographic area requested by the patient (or if unable, by the patient's family).  Yes   Patient/family informed of their freedom to choose among providers that offer the needed level of care, that participate in Medicare, Medicaid or managed care program needed by the patient, have an available bed and are willing to accept the patient.  Yes   Patient/family informed of [facility]'s ownership interest in [facility] and [facility], as well as of the fact that they are under no obligation to receive care at these facilities.  PASRR submitted to EDS on 08/08/15     PASRR number received on 08/08/15     Existing PASRR number confirmed on       FL2 transmitted to all facilities in geographic area requested by pt/family on 08/08/15     FL2 transmitted to all facilities within larger geographic area on       Patient informed that his/her managed care company has contracts with or will negotiate with certain facilities, including the following:            Patient/family informed of bed offers received.  Patient chooses bed at       Physician recommends and patient chooses bed at      Patient to be transferred to   on  .  Patient to be transferred to facility by       Patient family notified on   of transfer.  Name of family member notified:        PHYSICIAN Please prepare priority discharge summary, including medications, Please prepare prescriptions, Please sign FL2     Additional Comment:     _______________________________________________ Venita Lickampbell, Kamarie Veno B, LCSW 08/08/2015, 12:01 PM

## 2015-08-08 NOTE — NC FL2 (Signed)
[place] MEDICAID FL2 LEVEL OF CARE SCREENING TOOL     IDENTIFICATION  Patient Name: Craig Bennett Birthdate: 1929-04-27 Sex: male Admission Date (Current Location): 08/06/2015  [facility] and [place] Number:  Producer, television/film/video and Address:  [facility], [address], [place], [place] [address]      Provider Number: [id number]  Attending Physician Name and Address:  Alison Murray, MD  Relative Name and Phone Number:       Current Level of Care: Hospital Recommended Level of Care: Skilled Nursing Facility Prior Approval Number:    Date Approved/Denied:   PASRR Number: [id number]  Discharge Plan: SNF    Current Diagnoses: Patient Active Problem List   Diagnosis Date Noted  . Pulmonary embolus (HCC) 08/06/2015  . AKI (acute kidney injury) (HCC) 10/24/2014  . Atrial fibrillation, unspecified   . Syncope and collapse   . Syncope 10/22/2014  . Dementia 10/22/2014  . Chronic atrial fibrillation (HCC) 10/22/2014  . Orthostatic hypotension 10/22/2014  . Symptomatic bradycardia 10/22/2014  . Low testosterone 10/22/2014  . CKD (chronic kidney disease), stage III 10/22/2014  . H/O prostate cancer 10/22/2014    Orientation RESPIRATION BLADDER Height & Weight     Self, Place, Time, Situation  Normal Continent Weight: 75.252 kg (165 lb 14.4 oz) Height:  6' (182.9 cm)  BEHAVIORAL SYMPTOMS/MOOD NEUROLOGICAL BOWEL NUTRITION STATUS   (NONE)  (NONE) Continent  (Heart Healthy)  AMBULATORY STATUS COMMUNICATION OF NEEDS Skin   Limited Assist Verbally Normal                       Personal Care Assistance Level of Assistance  Bathing, Feeding, Dressing Bathing Assistance: Limited assistance Feeding assistance: Independent Dressing Assistance: Limited assistance     Functional Limitations Info  Sight, Hearing, Speech Sight Info: Adequate Hearing Info: Adequate Speech Info: Adequate    SPECIAL CARE FACTORS FREQUENCY  PT (By licensed PT), OT  (By licensed OT)     PT Frequency: 5/week OT Frequency: 5/week            Contractures Contractures Info: Not present    Additional Factors Info  Code Status, Allergies, Psychotropic Code Status Info: Full Allergies Info: NKDA Psychotropic Info: Namenda         Current Medications (08/08/2015):  This is the current hospital active medication list Current Facility-Administered Medications  Medication Dose Route Frequency Provider Last Rate Last Dose  . acetaminophen (TYLENOL) tablet 650 mg  650 mg Oral Q6H PRN  Art, DO   650 mg at 08/08/15 5621   Or  . acetaminophen (TYLENOL) suppository 650 mg  650 mg Rectal Q6H PRN  Art, DO      . antiseptic oral rinse (CPC / CETYLPYRIDINIUM CHLORIDE 0.05%) solution 7 mL  7 mL Mouth Rinse BID  Art, DO   7 mL at 08/08/15 0753  . aspirin EC tablet 81 mg  81 mg Oral QHS  Art, DO   81 mg at 08/07/15 2153  . heparin ADULT infusion 100 units/mL (25000 units/250 mL)  1,350 Units/hr Intravenous Continuous Emi Holes, RPH 13.5 mL/hr at 08/08/15 1049 1,350 Units/hr at 08/08/15 1049  . hydrALAZINE (APRESOLINE) injection 10 mg  10 mg Intravenous Q6H PRN  Art, DO      . levofloxacin (LEVAQUIN) IVPB 750 mg  750 mg Intravenous Q24H Emi Holes, [facility]      . memantine (NAMENDA) tablet 5 mg  5 mg Oral Daily Mayreli Alden ArtJessica U Vann, DO   5 mg at 08/08/15 0751  . multivitamin with minerals tablet 1 tablet  1 tablet Oral QHS Quenton FetterJessica B Millen, RPH   1 tablet at 08/07/15 2153  . sodium chloride flush (NS) 0.9 % injection 3 mL  3 mL Intravenous Q12H Wyatt Thorstenson ArtJessica U Vann, DO   3 mL at 08/08/15 [id number]     Discharge Medications: Please see discharge summary for a list of discharge medications.  Relevant Imaging Results:  Relevant Lab Results:   Additional Information SSN: [id number]  Venita Lickampbell, Monti Villers B, LCSW

## 2015-08-08 NOTE — Progress Notes (Signed)
Pharmacy Antibiotic Note  Seward MethJames D Bennett is a 82 y.o. male admitted with chest pain. CT angio positive for saddle PE and he was started on IV heparin. CXR consistent with bilateral lower lobe pneumonia and he is spiking fevers to 100.5. Pharmacy asked to begin levaquin. Renal function wnl.  Plan: 1) Levaquin 750mg  IV q24 2) Follow renal function, LOT  Height: 6' (182.9 cm) Weight: 165 lb 14.4 oz (75.252 kg) IBW/kg (Calculated) : 77.6  Temp (24hrs), Avg:99.2 F (37.3 C), Min:98 F (36.7 C), Max:100.5 F (38.1 C)   Recent Labs Lab 08/06/15 1530 08/07/15 0512 08/08/15 0528 08/08/15 0532  WBC 11.3* 9.9  --  8.8  CREATININE 1.08 0.89 0.91  --     Estimated Creatinine Clearance: 65.5 mL/min (by C-G formula based on Cr of 0.91).    No Known Allergies  Antimicrobials this admission: 5/10 Levaquin >>  Dose adjustments this admission: n/a  Microbiology results: 5/8 MRSA PCR: negative  Thank you for allowing pharmacy to be a part of this patient's care.  Fredrik RiggerMarkle, Craig Bennett Sue 08/08/2015 11:27 AM

## 2015-08-09 LAB — CBC
HCT: 37.8 % — ABNORMAL LOW (ref 39.0–52.0)
Hemoglobin: 12.4 g/dL — ABNORMAL LOW (ref 13.0–17.0)
MCH: 31.6 pg (ref 26.0–34.0)
MCHC: 32.8 g/dL (ref 30.0–36.0)
MCV: 96.2 fL (ref 78.0–100.0)
PLATELETS: 197 10*3/uL (ref 150–400)
RBC: 3.93 MIL/uL — ABNORMAL LOW (ref 4.22–5.81)
RDW: 12.8 % (ref 11.5–15.5)
WBC: 7.8 10*3/uL (ref 4.0–10.5)

## 2015-08-09 LAB — BLOOD CULTURE ID PANEL (REFLEXED)
Acinetobacter baumannii: NOT DETECTED
CANDIDA GLABRATA: NOT DETECTED
CANDIDA TROPICALIS: NOT DETECTED
CARBAPENEM RESISTANCE: NOT DETECTED
Candida albicans: NOT DETECTED
Candida krusei: NOT DETECTED
Candida parapsilosis: NOT DETECTED
ESCHERICHIA COLI: NOT DETECTED
Enterobacter cloacae complex: NOT DETECTED
Enterobacteriaceae species: NOT DETECTED
Enterococcus species: NOT DETECTED
HAEMOPHILUS INFLUENZAE: NOT DETECTED
Klebsiella oxytoca: NOT DETECTED
Klebsiella pneumoniae: NOT DETECTED
LISTERIA MONOCYTOGENES: NOT DETECTED
METHICILLIN RESISTANCE: NOT DETECTED
Neisseria meningitidis: NOT DETECTED
PROTEUS SPECIES: NOT DETECTED
Pseudomonas aeruginosa: NOT DETECTED
SERRATIA MARCESCENS: NOT DETECTED
STREPTOCOCCUS PYOGENES: NOT DETECTED
Staphylococcus aureus (BCID): NOT DETECTED
Staphylococcus species: NOT DETECTED
Streptococcus agalactiae: NOT DETECTED
Streptococcus pneumoniae: NOT DETECTED
Streptococcus species: NOT DETECTED
Vancomycin resistance: NOT DETECTED

## 2015-08-09 LAB — HEPARIN LEVEL (UNFRACTIONATED): HEPARIN UNFRACTIONATED: 0.29 [IU]/mL — AB (ref 0.30–0.70)

## 2015-08-09 LAB — HIV ANTIBODY (ROUTINE TESTING W REFLEX): HIV SCREEN 4TH GENERATION: NONREACTIVE

## 2015-08-09 MED ORDER — RIVAROXABAN 15 MG PO TABS
15.0000 mg | ORAL_TABLET | Freq: Two times a day (BID) | ORAL | Status: DC
  Administered 2015-08-09 – 2015-08-10 (×3): 15 mg via ORAL
  Filled 2015-08-09 (×3): qty 1

## 2015-08-09 MED ORDER — HEPARIN (PORCINE) IN NACL 100-0.45 UNIT/ML-% IJ SOLN: 1450.0000 [IU]/h | INTRAMUSCULAR | Status: AC

## 2015-08-09 MED ORDER — HEPARIN (PORCINE) IN NACL 100-0.45 UNIT/ML-% IJ SOLN: 1450.0000 [IU]/h | INTRAMUSCULAR | Status: DC

## 2015-08-09 MED ORDER — ATENOLOL 25 MG PO TABS
12.5000 mg | ORAL_TABLET | Freq: Every day | ORAL | Status: DC
  Administered 2015-08-09 – 2015-08-10 (×2): 12.5 mg via ORAL
  Filled 2015-08-09 (×2): qty 1

## 2015-08-09 MED ORDER — ASPIRIN EC 81 MG PO TBEC
81.0000 mg | DELAYED_RELEASE_TABLET | Freq: Every day | ORAL | Status: DC
  Administered 2015-08-09 – 2015-08-10 (×2): 81 mg via ORAL
  Filled 2015-08-09 (×2): qty 1

## 2015-08-09 MED ORDER — RIVAROXABAN 20 MG PO TABS: 20.0000 mg | ORAL_TABLET | Freq: Every day | ORAL | Status: DC

## 2015-08-09 NOTE — Clinical Social Work Note (Signed)
Clinical Social Worker continuing to follow patient and family for support and discharge planning needs.  CSW left a voicemail with patient friend [name]([name]) to provide bed offers and awaiting return call.  CSW remains available for support and to facilitate patient discharge needs once medically stable.  Macario GoldsJesse Alysandra Lobue, [place] [phone]

## 2015-08-09 NOTE — Progress Notes (Signed)
Physical Therapy Treatment Patient Details Name: Craig Bennett MRN: [id number] DOB: 01-02-1930 Today's Date: 08/09/2015    History of Present Illness 82 y.o. male admitted [facility] on [facility]/15 for SOB and found to have + PE.  Pt with significant PMHx of A-fib, syncope, dementia, CKD, CHF, COPD, and renal insufficiency.      PT Comments    Pt was able to progress gait into the hallway with VSS on RA.  He continues to be unsteady on his feet and has difficulty using RW safely (however, without is more unsteady).  He lives alone and has limited help from a friend at discharge.  I do not feel he is safe to return home at this time.  PT now recommending SNF level rehab before returning home alone where he will need to be at a mod I level of mobility to be safe.    Follow Up Recommendations  SNF     Equipment Recommendations  Rolling walker with 5" wheels    Recommendations for Other Services   NA     Precautions / Restrictions Precautions Precautions: Fall Precaution Comments: mildly unsteady on his feet    Mobility  Bed Mobility Overal bed mobility: Needs Assistance Bed Mobility: Supine to Sit     Supine to sit: Min guard     General bed mobility comments: using railing for leverage, verbal and tactile cues to initiate.   Transfers Overall transfer level: Needs assistance Equipment used: Rolling walker (2 wheeled) Transfers: Sit to/from Stand Sit to Stand: Min assist         General transfer comment: Min assist to support trunk from low bed.  Verbal cues for safe hand placement and slow descent when going to sit down in low recliner chair.   Ambulation/Gait Ambulation/Gait assistance: Min assist Ambulation Distance (Feet): 100 Feet Assistive device: Rolling walker (2 wheeled) Gait Pattern/deviations: Step-through pattern;Shuffle;Trunk flexed Gait velocity: decreased   General Gait Details: Pt has never used a RW before, max verbal cues for safe proximity and upright  posture.  Min assist to stabilize trunk for balance and help steer RW.               Balance Overall balance assessment: Needs assistance Sitting-balance support: Feet supported;No upper extremity supported Sitting balance-Leahy Scale: Good     Standing balance support: Bilateral upper extremity supported Standing balance-Leahy Scale: Poor                      Cognition Arousal/Alertness: Awake/alert Behavior During Therapy: WFL for tasks assessed/performed Overall Cognitive Status: Impaired/Different from baseline Area of Impairment: Problem solving     Memory: Decreased short-term memory       Problem Solving: Slow processing General Comments: Continues to be slow to process at times.  Saring out into space.     Exercises General Exercises - Lower Extremity Long Arc Quad: AROM;Both;10 reps;Seated Hip Flexion/Marching: AROM;Both;10 reps;Seated Toe Raises: AROM;Both;20 reps;Seated Heel Raises: AROM;Both;20 reps;Seated        Pertinent Vitals/Pain Pain Assessment: No/denies pain           PT Goals (current goals can now be found in the care plan section) Acute Rehab PT Goals Patient Stated Goal: to go home Progress towards PT goals: Progressing toward goals    Frequency  Min 2X/week    PT Plan Discharge plan needs to be updated;Frequency needs to be updated       End of Session   Activity Tolerance: Patient  limited by fatigue Patient left: in chair;with call bell/phone within reach;with chair alarm set     Time: 1115-1144 PT Time Calculation (min) (ACUTE ONLY): 29 min  Charges:  $Gait Training: 8-22 mins $Therapeutic Exercise: 8-22 mins                     Kairyn Olmeda B. Doninique Lwin, PT, DPT [phone]   08/09/2015, 11:51 AM

## 2015-08-09 NOTE — Progress Notes (Addendum)
ANTICOAGULATION CONSULT NOTE - Follow Up Consult  Pharmacy Consult for Heparin --> Xarelto Indication: pulmonary embolus  No Known Allergies  Patient Measurements: Height: 6' (182.9 cm) Weight: 167 lb (75.751 kg) IBW/kg (Calculated) : 77.6  Vital Signs: Temp: 98 F (36.7 C) (05/11 0819) Temp Source: Oral (05/11 0819) BP: 157/103 mmHg (05/11 0819) Pulse Rate: 80 (05/11 0819)  Labs:  Recent Labs  08/06/15 1530  08/07/15 0512 08/07/15 1238 08/08/15 0528 08/08/15 0532 08/08/15 0534 08/09/15 0509 08/09/15 0815  HGB 15.0  --  13.1  --   --  12.9*  --  12.4*  --   HCT 44.7  --  39.4  --   --  38.5*  --  37.8*  --   PLT 200  --  193  --   --  188  --  197  --   LABPROT 15.4*  --   --   --   --   --   --   --   --   INR 1.20  --   --   --   --   --   --   --   --   HEPARINUNFRC  --   < > 0.46 0.48  --   --  0.34  --  0.29*  CREATININE 1.08  --  0.89  --  0.91  --   --   --   --   < > = values in this interval not displayed.  Estimated Creatinine Clearance: 65.9 mL/min (by C-G formula based on Cr of 0.91).   Medications:  Heparin @ 1350 units/hr  Assessment: 85yom with hx of Afib, not on anticoagulation PTA (CHA2DS2VASc = 3, prev on coumadin but d/c'd ~ 07/2014), now with a new saddle PE (per CT angio 5/8). LE dopplers negative for DVT. He continues on IV heparin. Heparin level is slightly below goal at 0.29. CBC stable. No bleeding.   Goal of Therapy:  Heparin level 0.3-0.7 units/ml Monitor platelets by anticoagulation protocol: Yes   Plan:  1) Increase heparin to 1450 units/hr  2) Daily heparin level and CBC 3) Follow up oral anticoagulation  Fredrik RiggerMarkle, Veola Cafaro Sue 08/09/2015,10:01 AM   Addendum: Pharmacy now asked to change IV heparin to xarelto.  Plan: 1) Xarelto 15mg  bid x 21 days then 20mg  daily  2) Will complete education 3) Case management consult for cost  Fredrik RiggerMarkle, Jenisis Harmsen Sue 08/09/2015, 12:58 PM

## 2015-08-09 NOTE — Progress Notes (Signed)
Patient ID: Craig Bennett, male   DOB: 03/15/1930, 82 y.o.   MRN: [id number]  PROGRESS NOTE    Craig MethJames D Bennett  WUJ:[id number] DOB: 11/04/1929 DOA: 08/06/2015  PCP: Thayer HeadingsMACKENZIE,[name], MD   Brief Narrative:  82 y.o. male with medical history significant of atrial fibrillation and dementia. [facility] with difficulty breathing, pleuritic chest pain. Patient saw his primary care physician who sent him to ER for further evaluation.  Patient was subsequently found to have large saddle pulmonary embolism and he was started on anticoagulation with heparin.  Hospital course complicated with pneumonia. Patient started on Levaquin 08/08/2015.   Assessment & Plan:   Active Problems: Pleuritic chest pain / Acute large saddle pulmonary embolism  - CT angio chest showed large saddle pulmonary embolism - On Heparin drip now but will switch to xarelto today in anticipation that pt will be discharged in next 24-48 hours  - Lower extremity Doppler negative for DVT  Dementia without behavioral disturbance  - Continue memantine   Chronic atrial fibrillation - CHADS vasc score 2 - On AC with aspirin prior to admission - Now on heparin drip - switch to xarelto today  - Started low dose atenolol for HR control   Bilateral lower lobe pneumonia, unspecified organism / Leukocytosis - Bilateral lower lobe pneumonia detected on CXR - Strep pneumonia and HIV negative - Blood cultures are pending - Legionella is pending - Continue Levaquin for now   DVT prophylaxis: on Heparin drip; will switch to xarelto to see if patient tolerates. Code Status: full code  Family Communication: no family at the bedside this am  Disposition Plan: Appreciate physical therapy assessment and recommendation for skilled nursing facility. Patient has dementia and he is not able to take care of himself. He is appropriate for skilled nursing facility placement. Appreciate social worker assisted  with discharge plan.   Consultants:   None   Procedures:   2-D ECHO 08/07/2015 - ejection fraction 55%  Vascular lower extremity Doppler 08/07/2015 - no evidence of DVT in the right or left lower extremity  Antimicrobials:   Levaquin 08/08/2015 -->    Subjective: No overnight events. No respiratory distress.  Objective: Filed Vitals:   08/08/15 1946 08/09/15 0018 08/09/15 0400 08/09/15 0819  BP: 143/73 175/83 159/98 157/103  Pulse: 79 84 79 80  Temp: 98.9 F (37.2 C) 98.2 F (36.8 C) 97.9 F (36.6 C) 98 F (36.7 C)  TempSrc: Oral Oral Oral Oral  Resp: 20 18 20 18   Height:      Weight:   75.751 kg (167 lb)   SpO2: 96% 94% 100% 99%    Intake/Output Summary (Last 24 hours) at 08/09/15 1224 Last data filed at 08/09/15 0700  Gross per 24 hour  Intake 1150.08 ml  Output   2350 ml  Net -1199.92 ml   Filed Weights   08/07/15 0415 08/08/15 0444 08/09/15 0400  Weight: 74.5 kg (164 lb 3.9 oz) 75.252 kg (165 lb 14.4 oz) 75.751 kg (167 lb)    Examination:  General exam: Appears Stable, no acute distress Respiratory system: No wheezing or rhonchi Cardiovascular system: S1 & S2 appreciated, RRR Gastrointestinal system: Nontender, nondistended, appreciate bowel sounds Central nervous system: No focal neurological deficits. Extremities: no cyanosis, no edema Skin: warm, dry  Psychiatry: Mood & affect appropriate.   Data Reviewed: I have personally reviewed following labs and imaging studies  CBC:  Recent Labs Lab 08/06/15 1530 08/07/15 [id number] 08/08/15 0532 08/09/15 [id number]  WBC 11.3* 9.9 8.8 7.8  NEUTROABS 9.7*  --   --   --   HGB 15.0 13.1 12.9* 12.4*  HCT 44.7 39.4 38.5* 37.8*  MCV 96.5 96.3 96.0 96.2  PLT 200 193 188 197   Basic Metabolic Panel:  Recent Labs Lab 08/06/15 1530 08/07/15 0512 08/08/15 0528  NA 138 137 139  K 4.4 3.9 3.8  CL 103 104 105  CO2 GLUCOSE 110* 108* 110*  BUN CREATININE 1.08 0.89 0.91  CALCIUM 9.9 9.1  9.2   GFR: Estimated Creatinine Clearance: 65.9 mL/min (by C-G formula based on Cr of 0.91). Liver Function Tests:  Recent Labs Lab 08/06/15 1530  AST 20  ALT 12*  ALKPHOS 76  BILITOT 2.1*  PROT 7.3  ALBUMIN 3.7   No results for input(s): LIPASE, AMYLASE in the last 168 hours. No results for input(s): AMMONIA in the last 168 hours. Coagulation Profile:  Recent Labs Lab 08/06/15 1530  INR 1.20   Cardiac Enzymes: No results for input(s): CKTOTAL, CKMB, CKMBINDEX, TROPONINI in the last 168 hours. BNP (last 3 results) No results for input(s): PROBNP in the last 8760 hours. HbA1C: No results for input(s): HGBA1C in the last 72 hours. CBG: No results for input(s): GLUCAP in the last 168 hours. Lipid Profile: No results for input(s): CHOL, HDL, LDLCALC, TRIG, CHOLHDL, LDLDIRECT in the last 72 hours. Thyroid Function Tests: No results for input(s): TSH, T4TOTAL, FREET4, T3FREE, THYROIDAB in the last 72 hours. Anemia Panel: No results for input(s): VITAMINB12, FOLATE, FERRITIN, TIBC, IRON, RETICCTPCT in the last 72 hours. Urine analysis:    Component Value Date/Time   COLORURINE YELLOW 10/23/2014 1829   APPEARANCEUR CLEAR 10/23/2014 1829   LABSPEC 1.015 10/23/2014 1829   PHURINE 6.0 10/23/2014 1829   GLUCOSEU NEGATIVE 10/23/2014 1829   HGBUR NEGATIVE 10/23/2014 1829   BILIRUBINUR NEGATIVE 10/23/2014 1829   KETONESUR NEGATIVE 10/23/2014 1829   PROTEINUR NEGATIVE 10/23/2014 1829   UROBILINOGEN 1.0 10/23/2014 1829   NITRITE NEGATIVE 10/23/2014 1829   LEUKOCYTESUR NEGATIVE 10/23/2014 1829   Sepsis Labs: (procalcitonin:4,lacticidven:4)    Recent Results (from the past 240 hour(s))  MRSA PCR Screening     Status: None   Collection Time: 08/06/15 10:00 PM  Result Value Ref Range Status   MRSA by PCR NEGATIVE NEGATIVE Final    Comment:        The GeneXpert MRSA Assay (FDA approved for NASAL specimens only), is one component of a comprehensive MRSA  colonization surveillance program. It is not intended to diagnose MRSA infection nor to guide or monitor treatment for MRSA infections.       Radiology Studies: Dg Chest 2 View 08/06/2015 1. Bilateral lower lobe airspace disease compatible with pneumonia. 2. Bilateral pleural effusions. 3. Low lung volumes.   Ct Angio Chest Pe W/cm &/or Wo Cm 08/06/2015  1. Examination is positive for large volume pulmonary embolus including saddle embolus.   Ct Abdomen Pelvis W Contrast 08/06/2015 1. Examination is positive for large volume pulmonary embolus including saddle embolus.    Scheduled Meds: . antiseptic oral rinse  7 mL Mouth Rinse BID  . aspirin EC  81 mg Oral QHS  . levofloxacin (LEVAQUIN) IV  750 mg Intravenous Q24H  . memantine  5 mg Oral Daily  . multivitamin with minerals  1 tablet Oral QHS  . sodium chloride flush  3 mL Intravenous Q12H   Continuous Infusions: . heparin 1,450 Units/hr (08/09/15 1027)  LOS: 3 days    Time spent: 15 minutes  Greater than 50% of the time spent on counseling and coordinating the care.   Manson Passey, MD [facility] Pager [phone]  If 7PM-7AM, please contact night-coverage [email or web address] Password [facility] 08/09/2015, 12:24 PM

## 2015-08-09 NOTE — Progress Notes (Addendum)
Spoke with pt's daughter, [name], on phone. She lives and [place], but is available via email or phone. Her phone number is [phone]. Her email address is [email or web address].

## 2015-08-09 NOTE — Care Management Important Message (Signed)
Important Message  Patient Details  Name: Craig Bennett MRN: [id number] Date of Birth: 04/04/1929   Medicare Important Message Given:  Yes    Petra Dumler Abena 08/09/2015, 1:13 PM

## 2015-08-10 ENCOUNTER — Other Ambulatory Visit: Payer: Self-pay | Admitting: Licensed Clinical Social Worker

## 2015-08-10 LAB — LEGIONELLA PNEUMOPHILA SEROGP 1 UR AG: L. pneumophila Serogp 1 Ur Ag: NEGATIVE

## 2015-08-10 MED ORDER — DOXYCYCLINE HYCLATE 100 MG PO TABS
100.0000 mg | ORAL_TABLET | Freq: Two times a day (BID) | ORAL | Status: DC
Start: 2015-08-10 — End: 2016-03-23

## 2015-08-10 MED ORDER — ATENOLOL 25 MG PO TABS: 12.5000 mg | ORAL_TABLET | Freq: Every day | ORAL | Status: DC

## 2015-08-10 MED ORDER — ACETAMINOPHEN 325 MG PO TABS: 650.0000 mg | ORAL_TABLET | Freq: Four times a day (QID) | ORAL | Status: AC | PRN

## 2015-08-10 MED ORDER — RIVAROXABAN 20 MG PO TABS: 20.0000 mg | ORAL_TABLET | Freq: Every day | ORAL | Status: DC

## 2015-08-10 MED ORDER — RIVAROXABAN 15 MG PO TABS: 15.0000 mg | ORAL_TABLET | Freq: Two times a day (BID) | ORAL | Status: DC

## 2015-08-10 NOTE — Consult Note (Signed)
   [facility]   08/10/2015  Craig Bennett 04/17/1929 [id number]  [facility] RNCM regarding active patient discharging to a skilled facility. Please see the [facility] Social Worker notes for detailed information.  Please contact, for questions:  Charlesetta ShanksVictoria Ruthellen Tippy, RN BSN CCM [facility] Liaison  [phone] business mobile phone Toll free office [phone]

## 2015-08-10 NOTE — Progress Notes (Signed)
[facility] Will try again later.

## 2015-08-10 NOTE — Progress Notes (Signed)
[facility] Report given to [facility]. Patient keys and wallet zipped up in patients jacket pocket as requested by friend [name]. Patient discharged to SNF by way of [facility].

## 2015-08-10 NOTE — Discharge Instructions (Addendum)
Pulmonary Embolism A pulmonary embolism (PE) is a sudden blockage or decrease of blood flow in one lung or both lungs. Most blockages come from a blood clot that travels from the legs or the pelvis to the lungs. PE is a dangerous and potentially life-threatening condition if it is not treated right away. CAUSES A pulmonary embolism occurs most commonly when a blood clot travels from one of your veins to your lungs. Rarely, PE is caused by air, fat, amniotic fluid, or part of a tumor traveling through your veins to your lungs. RISK FACTORS A PE is more likely to develop in:  People who smoke.  People who areolder, especially over 65 years of age.  People who are overweight (obese).  People who sit or lie still for a long time, such as during long-distance travel (over 4 hours), bed rest, hospitalization, or during recovery from certain medical conditions like a stroke.  People who do not engage in much physical activity (sedentary lifestyle).  People who have chronic breathing disorders.  People whohave a personal or family history of blood clots or blood clotting disease.  People whohave peripheral vascular disease (PVD), diabetes, or some types of cancer.  People who haveheart disease, especially if the person had a recent heart attack or has congestive heart failure.  People who have neurological diseases that affect the legs (leg paresis).  People who have had a traumatic injury, such as breaking a hip or leg.  People whohave recently had major or lengthy surgery, especially on the hip, knee, or abdomen.  People who have hada central line placed inside a large vein.  People who takemedicines that contain the hormone estrogen. These include birth control pills and hormone replacement therapy.  Pregnancy or during childbirth or the postpartum period. SIGNS AND SYMPTOMS  The symptoms of a PE usually start suddenly and include:  Shortness of breath while active or at  rest.  Coughing or coughing up blood or blood-tinged mucus.  Chest pain that is often worse with deep breaths.  Rapid or irregular heartbeat.  Feeling light-headed or dizzy.  Fainting.  Feelinganxious.  Sweating. There may also be pain and swelling in a leg if that is where the blood clot started. These symptoms may represent a serious problem that is an emergency. Do not wait to see if the symptoms will go away. Get medical help right away. Call your local emergency services (911 in the [place] Do not drive yourself to the hospital. DIAGNOSIS Your health care provider will take a medical history and perform a physical exam. You may also have other tests, including:  Blood tests to assess the clotting properties of your blood, assess oxygen levels in your blood, and find blood clots.  Imaging tests, such as CT, ultrasound, MRI, X-ray, and other tests to see if you have clots anywhere in your body.  An electrocardiogram (ECG) to look for heart strain from blood clots in the lungs. TREATMENT The main goals of PE treatment are:  To stop a blood clot from growing larger.  To stop new blood clots from forming. The type of treatment that you receive depends on many factors, such as the cause of your PE, your risk for bleeding or developing more clots, and other medical conditions that you have. Sometimes, a combination of treatments is necessary. This condition may be treated with:  Medicines, including newer oral blood thinners (anticoagulants), warfarin, low molecular weight heparins, thrombolytics, or heparins.  Wearing compression stockings or using different types  of devices.  Surgery (rare) to remove the blood clot or to place a filter in your abdomen to stop the blood clot from traveling to your lungs. Treatments for a PE are often divided into immediate treatment, long-term treatment (up to 3 months after PE), and extended treatment (more than 3 months after PE). Your  treatment may continue for several months. This is called maintenance therapy, and it is used to prevent the forming of new blood clots. You can work with your health care provider to choose the treatment program that is best for you. What are anticoagulants? Anticoagulants are medicines that treat PEs. They can stop current blood clots from growing and stop new clots from forming. They cannot dissolve existing clots. Your body dissolves clots by itself over time. Anticoagulants are given by mouth, by injection, or through an IV tube. What are thrombolytics? Thrombolytics are clot-dissolving medicines that are used to dissolve a PE. They carry a high risk of bleeding, so they tend to be used only in severe cases or if you have very low blood pressure. HOME CARE INSTRUCTIONS If you are taking a newer oral anticoagulant:  Take the medicine every single day at the same time each day.  Understand what foods and drugs interact with this medicine.  Understand that there are no regular blood tests required when using this medicine.  Understandthe side effects of this medicine, including excessive bruising or bleeding. Ask your health care provider or pharmacist about other possible side effects. If you are taking warfarin:  Understand how to take warfarin and know which foods can affect how warfarin works in Veterinary surgeon.  Understand that it is dangerous to taketoo much or too little warfarin. Too much warfarin increases the risk of bleeding. Too little warfarin continues to allow the risk for blood clots.  Follow your PT and INR blood testing schedule. The PT and INR results allow your health care provider to adjust your dose of warfarin. It is very important that you have your PT and INR tested as often as told by your health care provider.  Avoid major changes in your diet, or tell your health care provider before you change your diet. Arrange a visit with a registered dietitian to answer your  questions. Many foods, especially foods that are high in vitamin K, can interfere with warfarin and affect the PT and INR results. Eat a consistent amount of foods that are high in vitamin K, such as:  Spinach, kale, broccoli, cabbage, collard greens, turnip greens, Brussels sprouts, peas, cauliflower, seaweed, and parsley.  Beef liver and pork liver.  Green tea.  Soybean oil.  Tell your health care provider about any and all medicines, vitamins, and supplements that you take, including aspirin and other over-the-counter anti-inflammatory medicines. Be especially cautious with aspirin and anti-inflammatory medicines. Do not take those before you ask your health care provider if it is safe to do so. This is important because many medicines can interfere with warfarin and affect the PT and INR results.  Do not start or stop taking any over-the-counter or prescription medicine unless your health care provider or pharmacist tells you to do so. If you take warfarin, you will also need to do these things:  Hold pressure over cuts for longer than usual.  Tell your dentist and other health care providers that you are taking warfarin before you have any procedures in which bleeding may occur.  Avoid alcohol or drink very small amounts. Tell your health care provider  if you change your alcohol intake.  Do not use tobacco products, including cigarettes, chewing tobacco, and e-cigarettes. If you need help quitting, ask your health care provider.  Avoid contact sports. General Instructions  Take over-the-counter and prescription medicines only as told by your health care provider. Anticoagulant medicines can have side effects, including easy bruising and difficulty stopping bleeding. If you are prescribed an anticoagulant, you will also need to do these things:  Hold pressure over cuts for longer than usual.  Tell your dentist and other health care providers that you are taking anticoagulants  before you have any procedures in which bleeding may occur.  Avoid contact sports.  Wear a medical alert bracelet or carry a medical alert card that says you have had a PE.  Ask your health care provider how soon you can go back to your normal activities. Stay active to prevent new blood clots from forming.  Make sure to exercise while traveling or when you have been sitting or standing for a long period of time. It is very important to exercise. Exercise your legs by walking or by tightening and relaxing your leg muscles often. Take frequent walks.  Wear compression stockings as told by your health care provider to help prevent more blood clots from forming.  Do not use tobacco products, including cigarettes, chewing tobacco, and e-cigarettes. If you need help quitting, ask your health care provider.  Keep all follow-up appointments with your health care provider. This is important. PREVENTION Take these actions to decrease your risk of developing another PE:  Exercise regularly. For at least 30 minutes every day, engage in:  Activity that involves moving your arms and legs.  Activity that encourages good blood flow through your body by increasing your heart rate.  Exercise your arms and legs every hour during long-distance travel (over 4 hours). Drink plenty of water and avoid drinking alcohol while traveling.  Avoid sitting or lying in bed for long periods of time without moving your legs.  Maintain a weight that is appropriate for your height. Ask your health care provider what weight is healthy for you.  If you are a woman who is over 58 years of age, avoid unnecessary use of medicines that contain estrogen. These include birth control pills.  Do not smoke, especially if you take estrogen medicines. If you need help quitting, ask your health care provider.  If you are at very high risk for PE, wear compression stockings.  If you recently had a PE, have regularly scheduled  ultrasound testing on your legs to check for new blood clots. If you are hospitalized, prevention measures may include:  Early walking after surgery, as soon as your health care provider says that it is safe.  Receiving anticoagulants to prevent blood clots. If you cannot take anticoagulants, other options may be available, such as wearing compression stockings or using different types of devices. SEEK IMMEDIATE MEDICAL CARE IF:  You have new or increased pain, swelling, or redness in an arm or leg.  You have numbness or tingling in an arm or leg.  You have shortness of breath while active or at rest.  You have chest pain.  You have a rapid or irregular heartbeat.  You feel light-headed or dizzy.  You cough up blood.  You notice blood in your vomit, bowel movement, or urine.  You have a fever. These symptoms may represent a serious problem that is an emergency. Do not wait to see if the symptoms will  go away. Get medical help right away. Call your local emergency services (911 in the [place] Do not drive yourself to the hospital.   This information is not intended to replace advice given to you by your health care provider. Make sure you discuss any questions you have with your health care provider.   Document Released: 03/14/2000 Document Revised: 12/06/2014 Document Reviewed: 07/12/2014 Elsevier Interactive Patient Education 2016 Elsevier Inc. Rivaroxaban oral tablets What is this medicine? RIVAROXABAN (ri va ROX a ban) is an anticoagulant (blood thinner). It is used to treat blood clots in the lungs or in the veins. It is also used after knee or hip surgeries to prevent blood clots. It is also used to lower the chance of stroke in people with a medical condition called atrial fibrillation. This medicine may be used for other purposes; ask your health care provider or pharmacist if you have questions. What should I tell my health care provider before I take this medicine? They  need to know if you have any of these conditions: -bleeding disorders -bleeding in the brain -blood in your stools (black or tarry stools) or if you have blood in your vomit -history of stomach bleeding -kidney disease -liver disease -low blood counts, like low white cell, platelet, or red cell counts -recent or planned spinal or epidural procedure -take medicines that treat or prevent blood clots -an unusual or allergic reaction to rivaroxaban, other medicines, foods, dyes, or preservatives -pregnant or trying to get pregnant -breast-feeding How should I use this medicine? Take this medicine by mouth with a glass of water. Follow the directions on the prescription label. Take your medicine at regular intervals. Do not take it more often than directed. Do not stop taking except on your doctor's advice. Stopping this medicine may increase your risk of a blood clot. Be sure to refill your prescription before you run out of medicine. If you are taking this medicine after hip or knee replacement surgery, take it with or without food. If you are taking this medicine for atrial fibrillation, take it with your evening meal. If you are taking this medicine to treat blood clots, take it with food at the same time each day. If you are unable to swallow your tablet, you may crush the tablet and mix it in applesauce. Then, immediately eat the applesauce. You should eat more food right after you eat the applesauce containing the crushed tablet. Talk to your pediatrician regarding the use of this medicine in children. Special care may be needed. Overdosage: If you think you have taken too much of this medicine contact a poison control center or emergency room at once. NOTE: This medicine is only for you. Do not share this medicine with others. What if I miss a dose? If you take your medicine once a day and miss a dose, take the missed dose as soon as you remember. If you take your medicine twice a day and miss  a dose, take the missed dose immediately. In this instance, 2 tablets may be taken at the same time. The next day you should take 1 tablet twice a day as directed. What may interact with this medicine? -aspirin and aspirin-like medicines -certain antibiotics like erythromycin, azithromycin, and clarithromycin -certain medicines for fungal infections like ketoconazole and itraconazole -certain medicines for irregular heart beat like amiodarone, quinidine, dronedarone -certain medicines for seizures like carbamazepine, phenytoin -certain medicines that treat or prevent blood clots like warfarin, enoxaparin, and dalteparin -conivaptan -diltiazem -felodipine -indinavir -  lopinavir; ritonavir -NSAIDS, medicines for pain and inflammation, like ibuprofen or naproxen -ranolazine -rifampin -ritonavir -St. John's wort -verapamil This list may not describe all possible interactions. Give your health care provider a list of all the medicines, herbs, non-prescription drugs, or dietary supplements you use. Also tell them if you smoke, drink alcohol, or use illegal drugs. Some items may interact with your medicine. What should I watch for while using this medicine? Visit your doctor or health care professional for regular checks on your progress. Your condition will be monitored carefully while you are receiving this medicine. Notify your doctor or health care professional and seek emergency treatment if you develop breathing problems; changes in vision; chest pain; severe, sudden headache; pain, swelling, warmth in the leg; trouble speaking; sudden numbness or weakness of the face, arm, or leg. These can be signs that your condition has gotten worse. If you are going to have surgery, tell your doctor or health care professional that you are taking this medicine. Tell your health care professional that you use this medicine before you have a spinal or epidural procedure. Sometimes people who take this  medicine have bleeding problems around the spine when they have a spinal or epidural procedure. This bleeding is very rare. If you have a spinal or epidural procedure while on this medicine, call your health care professional immediately if you have back pain, numbness or tingling (especially in your legs and feet), muscle weakness, paralysis, or loss of bladder or bowel control. Avoid sports and activities that might cause injury while you are using this medicine. Severe falls or injuries can cause unseen bleeding. Be careful when using sharp tools or knives. Consider using an Neurosurgeonelectric razor. Take special care brushing or flossing your teeth. Report any injuries, bruising, or red spots on the skin to your doctor or health care professional. What side effects may I notice from receiving this medicine? Side effects that you should report to your doctor or health care professional as soon as possible: -allergic reactions like skin rash, itching or hives, swelling of the face, lips, or tongue -back pain -redness, blistering, peeling or loosening of the skin, including inside the mouth -signs and symptoms of bleeding such as bloody or black, tarry stools; red or dark-brown urine; spitting up blood or brown material that looks like coffee grounds; red spots on the skin; unusual bruising or bleeding from the eye, gums, or nose Side effects that usually do not require medical attention (Report these to your doctor or health care professional if they continue or are bothersome.): -dizziness -muscle pain This list may not describe all possible side effects. Call your doctor for medical advice about side effects. You may report side effects to FDA at 1-800-FDA-1088. Where should I keep my medicine? Keep out of the reach of children. Store at room temperature between 15 and 30 degrees C (59 and 86 degrees F). Throw away any unused medicine after the expiration date. NOTE: This sheet is a summary. It may not  cover all possible information. If you have questions about this medicine, talk to your doctor, pharmacist, or health care provider.    2016, Elsevier/Gold Standard. (2014-03-15 12:45:34)  Information on my medicine - Craig Bennett (rivaroxaban)  This medication education was reviewed with me or my healthcare representative as part of my discharge preparation.  The pharmacist that spoke with me during my hospital stay was:  Fredrik RiggerMarkle, Wandra Babin Sue, [facility]  WHY WAS Craig HurlXARELTO PRESCRIBED FOR YOU? Craig Bennett was prescribed to treat blood  clots that may have been found in the veins of your legs (deep vein thrombosis) or in your lungs (pulmonary embolism) and to reduce the risk of them occurring again.  What do you need to know about Craig Bennett? The starting dose is one 15 mg tablet taken TWICE daily with food for the FIRST 21 DAYS then on 08/31/15  the dose is changed to one 20 mg tablet taken ONCE A DAY with your evening meal.  DO NOT stop taking Craig Bennett without talking to the health care provider who prescribed the medication.  Refill your prescription for 20 mg tablets before you run out.  After discharge, you should have regular check-up appointments with your healthcare provider that is prescribing your Craig Bennett.  In the future your dose may need to be changed if your kidney function changes by a significant amount.  What do you do if you miss a dose? If you are taking Craig Bennett TWICE DAILY and you miss a dose, take it as soon as you remember. You may take two 15 mg tablets (total 30 mg) at the same time then resume your regularly scheduled 15 mg twice daily the next day.  If you are taking Craig Bennett ONCE DAILY and you miss a dose, take it as soon as you remember on the same day then continue your regularly scheduled once daily regimen the next day. Do not take two doses of Craig Bennett at the same time.   Important Safety Information Craig Bennett is a blood thinner medicine that can cause bleeding. You should call  your healthcare provider right away if you experience any of the following: ? Bleeding from an injury or your nose that does not stop. ? Unusual colored urine (red or dark brown) or unusual colored stools (red or black). ? Unusual bruising for unknown reasons. ? A serious fall or if you hit your head (even if there is no bleeding).  Some medicines may interact with Craig Bennett and might increase your risk of bleeding while on Craig Bennett. To help avoid this, consult your healthcare provider or pharmacist prior to using any new prescription or non-prescription medications, including herbals, vitamins, non-steroidal anti-inflammatory drugs (NSAIDs) and supplements.  This website has more information on Craig Bennett: [email or web address].

## 2015-08-10 NOTE — Progress Notes (Addendum)
Called by Patients PCP Dr. Ronne BinningMckenzie to clarify if patient lives in an "independent living facility." [facility]

## 2015-08-10 NOTE — Clinical Social Work Placement (Signed)
   CLINICAL SOCIAL WORK PLACEMENT  NOTE  Date:  08/10/2015  Patient Details  Name: Craig Bennett MRN: [id number] Date of Birth: 02/17/1930  Clinical Social Work is seeking post-discharge placement for this patient at the Skilled  Nursing Facility level of care (*CSW will initial, date and re-position this form in  chart as items are completed):  Yes   Patient/family provided with [place] Clinical Social Work Department's list of facilities offering this level of care within the geographic area requested by the patient (or if unable, by the patient's family).  Yes   Patient/family informed of their freedom to choose among providers that offer the needed level of care, that participate in Medicare, Medicaid or managed care program needed by the patient, have an available bed and are willing to accept the patient.  Yes   Patient/family informed of [facility]'s ownership interest [facility], as well as of the fact that they are under no obligation to receive care at these facilities.  PASRR submitted to EDS on 08/08/15     PASRR number received on 08/08/15     Existing PASRR number confirmed on       FL2 transmitted to all facilities in geographic area requested by pt/family on 08/08/15     FL2 transmitted to all facilities within larger geographic area on       Patient informed that his/her managed care company has contracts with or will negotiate with certain facilities, including the following:        Yes   Patient/family informed of bed offers received.  [facility]     Physician recommends and patient chooses bed at      [facility]  Patient to be transferred to facility by Ambulance     Patient family notified on 08/10/15 of transfer.  Name of family member notified:  Patient friend [name] over the phone     PHYSICIAN Please prepare priority  discharge summary, including medications, Please prepare prescriptions, Please sign FL2     Additional Comment:   Craig GoldsJesse Xaviera Flaten, LCSW [phone]

## 2015-08-10 NOTE — Patient Outreach (Signed)
[facility] Care Management  08/10/2015  Seward MethJames D Boeve 10/02/1929 [id number]   Assessment- CSW received voice message from APS social worker Lebron ConnersLanae Anderson stating that she did conduct home visit with patient and had to a mini mental set up for today but is aware of his hospitalization. She states that she will follow up with hospital. She reports that she has no further updates at this time and that she will continue to follow patient and update me when needed. [name] Anderson's number is [phone].   CSW unsure which SNF patient will be discharging to.  Plan-CSW sent in basket message [facility]. CSW will continue to follow patient.  [name] Emillee Talsma, BSW, MSW, LCSW [facility] Care [facility] [email or web address] Phone: [phone] Fax: [phone]

## 2015-08-10 NOTE — Clinical Social Work Note (Signed)
Clinical Social Worker facilitated patient discharge including contacting patient family and facility to confirm patient discharge plans.  Clinical information faxed to facility and family agreeable with plan.  CSW arranged ambulance transport via [facility] to [facility] .  RN to call report prior to discharge.  Clinical Social Worker will sign off for now as social work intervention is no longer needed. Please consult us again if new need arises.  Jesse Yosef Krogh, LCSW [phone] 

## 2015-08-10 NOTE — Discharge Summary (Signed)
Physician Discharge Summary  Seward MethJames D Enck WGN:[id number] DOB: 01/07/1930 DOA: 08/06/2015  PCP: Thayer HeadingsMACKENZIE,[name], MD  Admit date: 08/06/2015 Discharge date: 08/10/2015  Recommendations for Outpatient Follow-up:  Patient is on aspirin as well as xarelto for a large saddle pulmonary embolism. If there is any sign of bleeding please stop aspirin and Xarelto and check hemoglobin. Make sure hemoglobin is stable prior to resumption of those medications.  Please note Xarelto is prescribed 15 mg twice daily through 08/31/2015 which is a part of the starter pack. From 08/31/2015 start 20 mg once daily dose.  Continue doxycycline for 5 days and discharged for possible pneumonia.  Follow up legionella results an outpatient basis.  Discharge Diagnoses:  Active Problems:   Dementia   Chronic atrial fibrillation (HCC)   Pulmonary embolus (HCC)    Discharge Condition: stable   Diet recommendation: as tolerated   History of present illness:  82 y.o. male with medical history significant of atrial fibrillation and dementia. [facility] with difficulty breathing, pleuritic chest pain. Patient saw his primary care physician who sent him to ER for further evaluation.  Patient was subsequently found to have large saddle pulmonary embolism and he was started on anticoagulation with heparin.  Hospital course complicated with pneumonia. Patient started on Levaquin 08/08/2015.   As part of pneumonia order set, blood cultures obtained. One blood cultures set negative and another set with gram-positive cocci in clusters, this is most likely coag negative and contaminated sample. Patient will be sent with doxycycline for 5 days which should cover for staph species.  Hospital Course:    Assessment & Plan:  Active Problems: Pleuritic chest pain / Acute large saddle pulmonary embolism  - CT angio chest showed large saddle pulmonary embolism - Patient was on heparin  drip at the time of the admission. We switched to Xarelto on 08/09/2015. No episodes of bleeding. Hemoglobin stable. - Please note that Xarelto is 50 mg twice daily through 08/31/2015 and after that 20 mg once a day indefinitely. - Lower extremity Doppler negative for DVT  Dementia without behavioral disturbance  - Continue memantine   Chronic atrial fibrillation - CHADS vasc score 2 - On AC with aspirin and Xarelto. Patient has large saddle pulmonary embolism. Instructions given if there is any sign of bleed or drop in hemoglobin consider at least stopping aspirin first and then Xarelto - Started low dose atenolol for HR control   Bilateral lower lobe pneumonia, unspecified organism / Leukocytosis - Bilateral lower lobe pneumonia detected on CXR - Strep pneumonia and HIV negative - Legionella still pending, please follow this up on an outpatient basis - Patient started on Levaquin but will switch to doxycycline to cover for staph species considering one of the blood cultures with gram-positive cocci in clusters. Most likely this is contaminant as indicated above.  Gram-positive cocci in clusters in one of the blood cultures - One set blood culture negative and another set with gram-positive cocci in clusters which likely is a contaminant and coag negative. We'll follow up on final report. Patient will be sent to skilled nursing facility with doxycycline for 5 days on discharge.   DVT prophylaxis: on xarelto  Code Status: full code  Family Communication: no family at the bedside this am     Consultants:   None  Procedures:   2-D ECHO 08/07/2015 - ejection fraction 55%  Vascular lower extremity Doppler 08/07/2015 - no evidence of DVT in the right or left lower extremity  Antimicrobials:   Levaquin 08/08/2015 --> 08/10/2015  Doxycycline for 5 days on discharge      Signed:  Manson Passey, MD  [facility] 08/10/2015, 8:23 AM  Pager #: [phone]  Time spent  in minutes: more than 30 minutes  Discharge Exam: Filed Vitals:   08/10/15 0653 08/10/15 0755  BP: 115/66 133/75  Pulse: 96 100  Temp: 99 F (37.2 C) 98.2 F (36.8 C)  Resp: 16 16   Filed Vitals:   08/09/15 2154 08/10/15 0025 08/10/15 0653 08/10/15 0755  BP: 131/85 132/86 115/66 133/75  Pulse: 63 71 96 100  Temp: 99.4 F (37.4 C) 98.8 F (37.1 C) 99 F (37.2 C) 98.2 F (36.8 C)  TempSrc: Oral Oral Oral Oral  Resp:  18 16 16   Height:      Weight:   75.887 kg (167 lb 4.8 oz)   SpO2: 90% 99% 95% 96%    General: Pt is not in acute distress Cardiovascular: Regular rate and rhythm, S1/S2 + Respiratory: Clear to auscultation bilaterally, no wheezing, no crackles, no rhonchi Abdominal: Soft, non tender, non distended, bowel sounds +, no guarding Extremities: no edema, no cyanosis, pulses palpable bilaterally DP and PT Neuro: Grossly nonfocal  Discharge Instructions  Discharge Instructions    Call MD for:  difficulty breathing, headache or visual disturbances    Complete by:  As directed      Call MD for:  persistant dizziness or light-headedness    Complete by:  As directed      Call MD for:  persistant nausea and vomiting    Complete by:  As directed      Call MD for:  severe uncontrolled pain    Complete by:  As directed      Diet - low sodium heart healthy    Complete by:  As directed      Discharge instructions    Complete by:  As directed   Patient is on aspirin as well as xarelto for a large saddle pulmonary embolism. If there is any sign of bleeding please stop aspirin and Xarelto and check hemoglobin. Make sure hemoglobin is stable prior to resumption of those medications.  Please note Xarelto is prescribed 15 mg twice daily through 08/31/2015 which is a part of the starter pack. From 08/31/2015 start 20 mg once daily dose.     Increase activity slowly    Complete by:  As directed             Medication List    TAKE these medications         acetaminophen 325 MG tablet  Commonly known as:  TYLENOL  Take 2 tablets (650 mg total) by mouth every 6 (six) hours as needed for mild pain (or Fever >/= 101).     aspirin 81 MG tablet  Take 81 mg by mouth at bedtime.     atenolol 25 MG tablet  Commonly known as:  TENORMIN  Take 0.5 tablets (12.5 mg total) by mouth daily.     doxycycline 100 MG tablet  Commonly known as:  VIBRA-TABS  Take 1 tablet (100 mg total) by mouth 2 (two) times daily.     memantine 5 MG tablet  Commonly known as:  NAMENDA  Take 1 tablet (5 mg total) by mouth daily.     multivitamin with minerals tablet  Take 1 tablet by mouth at bedtime.     Rivaroxaban 15 MG Tabs tablet  Commonly known as:  Carlena Hurl  Take 1 tablet (15 mg total) by mouth 2 (two) times daily with a meal.     rivaroxaban 20 MG Tabs tablet  Commonly known as:  XARELTO  Take 1 tablet (20 mg total) by mouth daily with supper.  Start taking on:  08/31/2015            Follow-up Information    Follow up with Thayer Headings, MD. Schedule an appointment as soon as possible for a visit in 1 week.   Specialty:  Internal Medicine   Why:  Follow up appt after recent hospitalization   Contact information:   [address] [place] [address] [phone]        The results of significant diagnostics from this hospitalization (including imaging, microbiology, ancillary and laboratory) are listed below for reference.    Significant Diagnostic Studies: Dg Chest 2 View  08/06/2015  CLINICAL DATA:  Shortness of breath and chest pain. Severe pain with breathing. EXAM: CHEST - 2 VIEW COMPARISON:  One-view chest x-ray 10/23/2014. FINDINGS: The heart is enlarged. Atherosclerotic calcifications are present at the aortic arch. Bilateral lower lobe airspace disease is present. The costophrenic angle is incompletely imaged. Small effusions are suspected. The lung volumes are low. The upper lung in foot are clear. The visualized soft  tissues and bony thorax are unremarkable. IMPRESSION: 1. Bilateral lower lobe airspace disease compatible with pneumonia. 2. Bilateral pleural effusions. 3. Low lung volumes. Electronically Signed   By: Marin Roberts M.D.   On: 08/06/2015 15:44   Ct Angio Chest Pe W/cm &/or Wo Cm  08/06/2015  CLINICAL DATA:  Shortness of breath while lying flat. Elevated D-dimer. EXAM: CT ANGIOGRAPHY CHEST CT ABDOMEN AND PELVIS WITH CONTRAST TECHNIQUE: Multidetector CT imaging of the chest was performed using the standard protocol during bolus administration of intravenous contrast. Multiplanar CT image reconstructions and MIPs were obtained to evaluate the vascular anatomy. Multidetector CT imaging of the abdomen and pelvis was performed using the standard protocol during bolus administration of intravenous contrast. CONTRAST:  100 cc of Isovue 370 COMPARISON:  None. FINDINGS: CTA CHEST FINDINGS Mediastinum: There is a large saddle embolus involving the right and left pulmonary arteries. There are also bilateral segmental pulmonary artery filling defects as well as subsegmental filling defects. The trachea appears patent and is midline. Normal appearance of the esophagus. There is aortic atherosclerosis noted. LAD coronary artery and left circumflex coronary artery filling defects are identified. Prominent right hilar lymph node measures 9 mm. Lungs/Pleura: Small right pleural effusion is identified. Atelectasis and airspace consolidation within the lung bases noted right greater than left compatible with pulmonary infarct. In the right upper low there is a 4 x 2 mm nodule (3 mm mean diameter) on image 26 of series 507. Musculoskeletal: Mild spondylosis is noted within the thoracic spine. No aggressive lytic or sclerotic bone lesions. Compression fracture within the lower thoracic spine is identified at the T9 level. This is age indeterminate with loss of 50% of the vertebral body height, image 100 of series 503. There is  also a fracture deformity involving the mid body of sternum. This appears chronic and healed. CT ABDOMEN and PELVIS FINDINGS Hepatobiliary: Multiple fluid attenuating structures are identified within the liver compatible with cysts. There is a enhancing lesion within the posterior right lobe of liver which likely represents a hemangioma. This measures 2.5 cm, image 29 of series 601. The gallbladder is normal. No biliary dilatation. Pancreas: Normal appearance of the pancreas. Spleen: Negative Adrenals/Urinary Tract: The  adrenal glands are both normal. Unremarkable appearance of both kidneys. Urinary bladder appears normal. Stomach/Bowel: The stomach is within normal limits. The small bowel loops have a normal course and caliber. No obstruction. Normal appearance of the colon. No pathologic dilatation of the small or large bowel loops identified Vascular/Lymphatic: Calcified atherosclerotic disease involves the abdominal aorta. No aneurysm. No enlarged retroperitoneal or mesenteric adenopathy. No enlarged pelvic or inguinal lymph nodes. Reproductive: Seed implants are noted within the prostate gland. Other: There is no ascites or focal fluid collections within the abdomen or pelvis. Previous mesh repair of ventral abdominal wall hernia. Musculoskeletal: The bones appear osteopenic. There is a scoliosis deformity involving the lumbar spine which is convex towards the left. Compression fractures involve T9, L1, L2, L3 and L4. L5-S1 degenerative disc disease noted. Review of the MIP images confirms the above findings. IMPRESSION: 1. Examination is positive for large volume pulmonary embolus including saddle embolus. Critical Value/emergent results were called by telephone at the time of interpretation on 08/06/2015 at 6:12 pm to Dr. Chaney Malling , who verbally acknowledged these results. 2. Airspace consolidation within the lung bases likely reflecting pulmonary infarct. 3. No evidence for bowel obstruction. 4. Liver cysts  and hemangioma 5. Aortic atherosclerosis 6. Compression fractures are noted involving the T9, L1, L2, L3, L4 vertebra. These are all age indeterminate. There are is also an old sternal fracture. Electronically Signed   By: Signa Kell M.D.   On: 08/06/2015 18:13   Ct Abdomen Pelvis W Contrast  08/06/2015  CLINICAL DATA:  Shortness of breath while lying flat. Elevated D-dimer. EXAM: CT ANGIOGRAPHY CHEST CT ABDOMEN AND PELVIS WITH CONTRAST TECHNIQUE: Multidetector CT imaging of the chest was performed using the standard protocol during bolus administration of intravenous contrast. Multiplanar CT image reconstructions and MIPs were obtained to evaluate the vascular anatomy. Multidetector CT imaging of the abdomen and pelvis was performed using the standard protocol during bolus administration of intravenous contrast. CONTRAST:  100 cc of Isovue 370 COMPARISON:  None. FINDINGS: CTA CHEST FINDINGS Mediastinum: There is a large saddle embolus involving the right and left pulmonary arteries. There are also bilateral segmental pulmonary artery filling defects as well as subsegmental filling defects. The trachea appears patent and is midline. Normal appearance of the esophagus. There is aortic atherosclerosis noted. LAD coronary artery and left circumflex coronary artery filling defects are identified. Prominent right hilar lymph node measures 9 mm. Lungs/Pleura: Small right pleural effusion is identified. Atelectasis and airspace consolidation within the lung bases noted right greater than left compatible with pulmonary infarct. In the right upper low there is a 4 x 2 mm nodule (3 mm mean diameter) on image 26 of series 507. Musculoskeletal: Mild spondylosis is noted within the thoracic spine. No aggressive lytic or sclerotic bone lesions. Compression fracture within the lower thoracic spine is identified at the T9 level. This is age indeterminate with loss of 50% of the vertebral body height, image 100 of series 503.  There is also a fracture deformity involving the mid body of sternum. This appears chronic and healed. CT ABDOMEN and PELVIS FINDINGS Hepatobiliary: Multiple fluid attenuating structures are identified within the liver compatible with cysts. There is a enhancing lesion within the posterior right lobe of liver which likely represents a hemangioma. This measures 2.5 cm, image 29 of series 601. The gallbladder is normal. No biliary dilatation. Pancreas: Normal appearance of the pancreas. Spleen: Negative Adrenals/Urinary Tract: The adrenal glands are both normal. Unremarkable appearance of both kidneys. Urinary  bladder appears normal. Stomach/Bowel: The stomach is within normal limits. The small bowel loops have a normal course and caliber. No obstruction. Normal appearance of the colon. No pathologic dilatation of the small or large bowel loops identified Vascular/Lymphatic: Calcified atherosclerotic disease involves the abdominal aorta. No aneurysm. No enlarged retroperitoneal or mesenteric adenopathy. No enlarged pelvic or inguinal lymph nodes. Reproductive: Seed implants are noted within the prostate gland. Other: There is no ascites or focal fluid collections within the abdomen or pelvis. Previous mesh repair of ventral abdominal wall hernia. Musculoskeletal: The bones appear osteopenic. There is a scoliosis deformity involving the lumbar spine which is convex towards the left. Compression fractures involve T9, L1, L2, L3 and L4. L5-S1 degenerative disc disease noted. Review of the MIP images confirms the above findings. IMPRESSION: 1. Examination is positive for large volume pulmonary embolus including saddle embolus. Critical Value/emergent results were called by telephone at the time of interpretation on 08/06/2015 at 6:12 pm to Dr. Chaney Malling , who verbally acknowledged these results. 2. Airspace consolidation within the lung bases likely reflecting pulmonary infarct. 3. No evidence for bowel obstruction. 4.  Liver cysts and hemangioma 5. Aortic atherosclerosis 6. Compression fractures are noted involving the T9, L1, L2, L3, L4 vertebra. These are all age indeterminate. There are is also an old sternal fracture. Electronically Signed   By: Signa Kell M.D.   On: 08/06/2015 18:13    Microbiology: MRSA PCR Screening     Status: None   Collection Time: 08/06/15 10:00 PM  Result Value Ref Range Status   MRSA by PCR NEGATIVE NEGATIVE Final  Culture, blood (routine x 2) Call MD if unable to obtain prior to antibiotics being given     Status: None (Preliminary result)   Collection Time: 08/08/15 11:10 AM  Result Value Ref Range Status   Specimen Description BLOOD LEFT ARM  Final   Special Requests   Final    BOTTLES DRAWN AEROBIC AND ANAEROBIC 10CC AER 8CC ANA   Culture NO GROWTH 1 DAY  Final   Report Status PENDING  Incomplete  Culture, blood (routine x 2) Call MD if unable to obtain prior to antibiotics being given     Status: None (Preliminary result)   Collection Time: 08/08/15 11:20 AM  Result Value Ref Range Status   Specimen Description BLOOD LEFT ARM  Final   Special Requests   Final    BOTTLES DRAWN AEROBIC AND ANAEROBIC  3CC ANA 5CC AER   Culture  Setup Time   Final    GRAM POSITIVE COCCI IN CLUSTERS AEROBIC BOTTLE ONLY Organism ID to follow    Culture TOO YOUNG TO READ  Final   Report Status PENDING  Incomplete  Blood Culture ID Panel (Reflexed)     Status: None   Collection Time: 08/08/15 11:20 AM  Result Value Ref Range Status   Enterococcus species NOT DETECTED NOT DETECTED Final   Vancomycin resistance NOT DETECTED NOT DETECTED Final   Listeria monocytogenes NOT DETECTED NOT DETECTED Final   Staphylococcus species NOT DETECTED NOT DETECTED Final   Staphylococcus aureus NOT DETECTED NOT DETECTED Final   Methicillin resistance NOT DETECTED NOT DETECTED Final   Streptococcus species NOT DETECTED NOT DETECTED Final   Streptococcus agalactiae NOT DETECTED NOT DETECTED Final    Streptococcus pneumoniae NOT DETECTED NOT DETECTED Final   Streptococcus pyogenes NOT DETECTED NOT DETECTED Final   Acinetobacter baumannii NOT DETECTED NOT DETECTED Final   Enterobacteriaceae species NOT DETECTED NOT DETECTED Final  Enterobacter cloacae complex NOT DETECTED NOT DETECTED Final   Escherichia coli NOT DETECTED NOT DETECTED Final   Klebsiella oxytoca NOT DETECTED NOT DETECTED Final   Klebsiella pneumoniae NOT DETECTED NOT DETECTED Final   Proteus species NOT DETECTED NOT DETECTED Final   Serratia marcescens NOT DETECTED NOT DETECTED Final   Carbapenem resistance NOT DETECTED NOT DETECTED Final   Haemophilus influenzae NOT DETECTED NOT DETECTED Final   Neisseria meningitidis NOT DETECTED NOT DETECTED Final   Pseudomonas aeruginosa NOT DETECTED NOT DETECTED Final   Candida albicans NOT DETECTED NOT DETECTED Final   Candida glabrata NOT DETECTED NOT DETECTED Final   Candida krusei NOT DETECTED NOT DETECTED Final   Candida parapsilosis NOT DETECTED NOT DETECTED Final   Candida tropicalis NOT DETECTED NOT DETECTED Final     Labs: Basic Metabolic Panel:  Recent Labs Lab 08/06/15 1530 08/07/15 0512 08/08/15 0528  NA 138 137 139  K 4.4 3.9 3.8  CL 103 104 105  CO2 24 23 24   GLUCOSE 110* 108* 110*  BUN 15 12 13   CREATININE 1.08 0.89 0.91  CALCIUM 9.9 9.1 9.2   Liver Function Tests:  Recent Labs Lab 08/06/15 1530  AST 20  ALT 12*  ALKPHOS 76  BILITOT 2.1*  PROT 7.3  ALBUMIN 3.7   No results for input(s): LIPASE, AMYLASE in the last 168 hours. No results for input(s): AMMONIA in the last 168 hours. CBC:  Recent Labs Lab 08/06/15 1530 08/07/15 0512 08/08/15 0532 08/09/15 0509  WBC 11.3* 9.9 8.8 7.8  NEUTROABS 9.7*  --   --   --   HGB 15.0 13.1 12.9* 12.4*  HCT 44.7 39.4 38.5* 37.8*  MCV 96.5 96.3 96.0 96.2  PLT 200 193 188 197   Cardiac Enzymes: No results for input(s): CKTOTAL, CKMB, CKMBINDEX, TROPONINI in the last 168 hours. BNP: BNP (last 3  results)  Recent Labs  08/06/15 1530  BNP 98.0    ProBNP (last 3 results) No results for input(s): PROBNP in the last 8760 hours.  CBG: No results for input(s): GLUCAP in the last 168 hours.

## 2015-08-11 LAB — CULTURE, BLOOD (ROUTINE X 2)

## 2015-08-13 ENCOUNTER — Other Ambulatory Visit: Payer: Self-pay | Admitting: Licensed Clinical Social Worker

## 2015-08-13 NOTE — Patient Outreach (Signed)
[facility]) Care Management  08/13/2015  Craig Bennett 1929/12/06 [id number]  Assessment- CSW received call from Craig Bennett who had patient and friend Craig Bennett in her office today as he needed to get some items to take back to SNF. Patient is agreeable to be put on waiting list for the [facility] and Response Program.  CSW contacted [facility] and Response Program and left HIPPA compliant voice message.  Plan-CSW will await to hear back from program in order to make referral.  [facility] CM Care Plan Problem One        Most Recent Value   Care Plan Problem One  Lack of community resources and recent SNF placement   Role Documenting the Problem One  Clinical Social Worker   Care Plan for Problem One  Active   [facility] Long Term Goal (31-90 days)  Patient will have a safe and stable transition back home from SNF within 90 days   [facility] Long Term Goal Start Date  08/13/15   Interventions for Problem One Long Term Goal  CSW will complete SNF visit on 08/16/15. CSW will assist patient with gaining services, support and medical equipment within 90 days to ensure a safe and stable transition back home.   [facility] CM Short Term Goal #1 (0-30 days)  Patient will be successfully evaluated by APS in 30 days   [facility] CM Short Term Goal #1 Start Date  08/13/15   Interventions for Short Term Goal #1  CSW has made APS report and will communicate with APS social worker to get appropriate updates and status decision.   [facility] CM Short Term Goal #2 (0-30 days)  Patient will gain Medicaid MQB within 30 days   [facility] CM Short Term Goal #2 Start Date  07/17/15   [facility] CM Short Term Goal #2 Met Date  08/13/15   Interventions for Short Term Goal #2  Patient has been approved for Medicaid MQB      Craig Bennett, BSW, MSW, [email or web address] Phone: [phone] Fax: [phone]

## 2015-08-13 NOTE — Patient Outreach (Signed)
[facility]) Care Management  08/13/2015  Craig Bennett 12/06/1929 [id number]   Assessment-CSW received incoming call from patient's friend Craig Bennett. Craig Bennett reports that he has visited patient over the weekend and talked to him over the phone. Craig Bennett states that patient seems eager to come home already. Craig Bennett has washed the clothes that he had at the hospital and will bringing them to SNF today or tomorrow for patient. Patient is in need of additional things from his apartment and has asked Craig Bennett if he can take him there to gain items. Craig Bennett has contacted a nurse at the facility and is awaiting to hear back on decision. Craig Bennett has received 3 emails from daughter Craig Bennett who resides in [place] and she is aware of his status.  Craig Bennett reports that patient has been walking some with his walker. CSW informed friend that she will be completing SNF visit on 08/16/15. Friend wishes to be there during visit. Friend reports that he will find out what time he has physical therapy and will contact CSW back in order to schedule SNF visit.  Plan-CSW will await to hear back from patient's friend in order to schedule SNF visit.  Craig Bennett, BSW, MSW, LCSW [facility] Care Management [email or web address] Phone: [phone] Fax: [phone]

## 2015-08-14 LAB — CULTURE, BLOOD (ROUTINE X 2): Culture: NO GROWTH

## 2015-08-16 ENCOUNTER — Other Ambulatory Visit: Payer: Self-pay | Admitting: Licensed Clinical Social Worker

## 2015-08-16 NOTE — Patient Outreach (Addendum)
[facility]) Care Management  [facility] Social Work  08/16/2015  Craig Bennett October 31, 1929 [id number]  Encounter Medications:  Outpatient Encounter Prescriptions as of 08/16/2015  Medication Sig  . acetaminophen (TYLENOL) 325 MG tablet Take 2 tablets (650 mg total) by mouth every 6 (six) hours as needed for mild pain (or Fever >/= 101).  Marland Kitchen aspirin 81 MG tablet Take 81 mg by mouth at bedtime.   Marland Kitchen atenolol (TENORMIN) 25 MG tablet Take 0.5 tablets (12.5 mg total) by mouth daily.  Marland Kitchen doxycycline (VIBRA-TABS) 100 MG tablet Take 1 tablet (100 mg total) by mouth 2 (two) times daily.  . memantine (NAMENDA) 5 MG tablet Take 1 tablet (5 mg total) by mouth daily.  . Multiple Vitamins-Minerals (MULTIVITAMIN WITH MINERALS) tablet Take 1 tablet by mouth at bedtime.   . Rivaroxaban (XARELTO) 15 MG TABS tablet Take 1 tablet (15 mg total) by mouth 2 (two) times daily with a meal.  . [START ON 08/31/2015] rivaroxaban (XARELTO) 20 MG TABS tablet Take 1 tablet (20 mg total) by mouth daily with supper.   No facility-administered encounter medications on file as of 08/16/2015.    Functional Status:  In your present state of health, do you have any difficulty performing the following activities: 08/08/2015 06/28/2015  Hearing? N Y  Vision? N Y  Difficulty concentrating or making decisions? Malvin Johns  Walking or climbing stairs? Y Y  Dressing or bathing? N Y  Doing errands, shopping? Y Y  Preparing Food and eating ? - -  Using the Toilet? - -  In the past six months, have you accidently leaked urine? - -  Do you have problems with loss of bowel control? - -  Managing your Medications? - -  Managing your Finances? - -  Housekeeping or managing your Housekeeping? - -    Fall/Depression Screening:  PHQ 2/9 Scores 08/16/2015 06/28/2015 06/18/2015 05/16/2015 01/08/2015 12/06/2014  PHQ - 2 Score 0 0 0 0 0 0    Assessment: CSW completed SNF visit at [facility] on 08/16/15 with friend [name] Plyler present. CSW  spoke with SNF social worker in regards to patient. She reports that patient states that he is ready to leave facility and go home with a private pay care giver agency. Staff have encouraged him to stay in order to continue improving his overall health and well-being. Patient looks extremely well and reports that he is feeling "a lot better." Patient is oriented to person, place and time. Patient reports that his friend Claris Che came to see him yesterday and brought him flowers. Patient reports that a [facility] came to visit him yesterday and that he thought the services were free. CSW informed him that caregiver services are 18.50 per hour at the agency. George Ina spent most of the time during the session encouraging patient to continue to stay to at facility and patient became agreeable to do so. He reports "I will do it. I like the food here." Patient reports that he has not showered since he arrived at [facility]. CSW spoke to patient' LPN and she shared that he has refused to take shower because someone has to be present. LPN shares that he might consider allowing a male to be present. LPN brought in male nurse who introduced self and explained the process of showering and his options so that he can have his privacy. Patient is now willing to take shower tomorrow. Patient walked to area with piano and played for the residents  at the SNF.  Patient's walking has improved drastically. Patient reports that he has gained a few pounds now that he is eating three full meals per day.  CSW contacted APS social worker Lebron ConnersLanae Anderson and received updates. She completed SNF visit this week but patient's mini mental has not been completed in order to evaluate his level of competency. [name] shares that this should happen by next week. [name] reports that she will not recommend him for guardianship yet because "he is not there yet." She is agreeable to updating CSW once decision has been made about his case. [name]  reports that she is waiting to hear back from [facility]. [name] reports that she is very aware of patient and his concerns.   CSW completed call to friend Darla LeschesJim Plyler and requested that he contact APS Child psychotherapistsocial worker. Friend agreeable to do so.   Plan: CSW will follow up with SNF within one week. CSW will continue to provide social work assistance.  [name] Aitana Burry, BSW, MSW, LCSW [facility] Care Management [email or web address] Phone: [phone] Fax: [phone]

## 2015-08-22 ENCOUNTER — Other Ambulatory Visit: Payer: Self-pay | Admitting: Licensed Clinical Social Worker

## 2015-08-22 NOTE — Patient Outreach (Signed)
[facility]) Care Management  [facility] Social Work  08/22/2015  Craig Bennett 1929/04/30 [id number]  Encounter Medications:  Outpatient Encounter Prescriptions as of 08/22/2015  Medication Sig  . acetaminophen (TYLENOL) 325 MG tablet Take 2 tablets (650 mg total) by mouth every 6 (six) hours as needed for mild pain (or Fever >/= 101).  Marland Kitchen aspirin 81 MG tablet Take 81 mg by mouth at bedtime.   Marland Kitchen atenolol (TENORMIN) 25 MG tablet Take 0.5 tablets (12.5 mg total) by mouth daily.  Marland Kitchen doxycycline (VIBRA-TABS) 100 MG tablet Take 1 tablet (100 mg total) by mouth 2 (two) times daily.  . memantine (NAMENDA) 5 MG tablet Take 1 tablet (5 mg total) by mouth daily.  . Multiple Vitamins-Minerals (MULTIVITAMIN WITH MINERALS) tablet Take 1 tablet by mouth at bedtime.   . Rivaroxaban (XARELTO) 15 MG TABS tablet Take 1 tablet (15 mg total) by mouth 2 (two) times daily with a meal.  . [START ON 08/31/2015] rivaroxaban (XARELTO) 20 MG TABS tablet Take 1 tablet (20 mg total) by mouth daily with supper.   No facility-administered encounter medications on file as of 08/22/2015.    Functional Status:  In your present state of health, do you have any difficulty performing the following activities: 08/08/2015 06/28/2015  Hearing? N Y  Vision? N Y  Difficulty concentrating or making decisions? [name]  Walking or climbing stairs? Y Y  Dressing or bathing? N Y  Doing errands, shopping? Y Y  Preparing Food and eating ? - -  Using the Toilet? - -  In the past six months, have you accidently leaked urine? - -  Do you have problems with loss of bowel control? - -  Managing your Medications? - -  Managing your Finances? - -  Housekeeping or managing your Housekeeping? - -    Fall/Depression Screening:  PHQ 2/9 Scores 08/16/2015 06/28/2015 06/18/2015 05/16/2015 01/08/2015 12/06/2014  PHQ - 2 Score 0 0 0 0 0 0    Assessment: CSW completed SNF visit on 08/22/15 in order to complete care planning meeting with D/C  planner and friend [name]. CSW met with patient first in his room before walking him to the d/c planner meeting. Patient's walking and posture has improved drastically. Cora, d/c planner states that patient was previously agreeable to long term placement but is not agreeable at this time. Patient continues to change his mind on long term placement. Cora spent most of the care planning meeting encouraging patient to stay at [facility] long term and educated him on the concerns of staff members at facility in regards to his ability to not be able to provide care for himself if he discharges. CSW contacted patient's Medicaid caseworker to update her. His Medicaid caseworker is Shelly Flatten. His Medicaid ID number is [id number] R. Cora directed [place] to what his room would look like if he were to stay at facility long term. Patient was able to walk without issues to long term care unit that is on the other side of the facility. Patient states at this time he is unsure if he would like to stay at facility long term. CSW completed call to Leota Sauers to have her on phone during the rest of the care planning meeting per patient request. Patient has until next Tuesday to decide as his set discharge date will be set for 08/29/15. Cora spent time educating patient and friend on the difference between long term care at [facility] and ALF placement. Mel Almond also provided education  on the benefits of staying at facility long term such as getting feed three meals per day, washing his clothes and being able to stay over night 85 nights with family or friend out of the year if needed. [name] thought that [place] caseworker would be at care planning meeting. CSW completed call to Devoria Albe APS caseworker to provide updates. Later, Alcide Clever and a social worker came to complete a mini mental evaluation. Patient was not oriented to time, day or year but was oriented to person and place. APS caseworker also spent time encouraging  patient to consider long term placement. APS caseworker states that they will have the results of mini mental assessment by the end of the week and will decide if patient will have to through guardianship petition.   Plan: CSW will complete next outreach to facility on 08/28/15 to find out patient's decision on long term placement.  [facility] CM Care Plan Problem One        Most Recent Value   Care Plan Problem One  SNF admission   Role Documenting the Problem One  Clinical Social Worker   Care Plan for Problem One  Active   [facility] Long Term Goal (31-90 days)  Patient will have a safe and stable discharge back home within 90 days   [facility] Long Term Goal Start Date  08/22/15   Interventions for Problem One Long Term Goal  CSW has completed two SNF visits since patient has been admitted to SNF. CSW went to care planning meeting at [facility]. CSW will work with SNF discharge planner and friend to coordinate services   [facility] CM Short Term Goal #1 (0-30 days)  Patient will be successfully evaluated by APS in 30 days   [facility] CM Short Term Goal #1 Start Date  08/13/15   Interventions for Short Term Goal #1  CSW has made APS report and will communicate with APS social worker to get appropriate updates and status decision.   [facility] CM Short Term Goal #2 (0-30 days)  Patient will be educated on long term placement witin 30 days   [facility] CM Short Term Goal #2 Start Date  08/22/15   Interventions for Short Term Goal #2  CSW will complete care planning meeting and educate him on options of long term placement within 30 days      [name], [place], MSW, [email or web address] Phone: [phone] Fax: [phone]

## 2015-08-23 ENCOUNTER — Other Ambulatory Visit: Payer: Self-pay | Admitting: Licensed Clinical Social Worker

## 2015-08-23 NOTE — Patient Outreach (Signed)
[facility]N) Care Management  08/23/2015  Craig Bennett 02/14/1930 [id number]   Assessment- CSW received call from patient's friend Craig Bennett. Craig Bennett had questions in regards to making the decision on whether patient returns home or stays at facility long term. Patient had several questions in regards to the APS status as well.  Plan-CSW will await to hear back from APS Craig Bennett and await for patient and friend's decision on long term stay.  Craig Bennett, BSW, MSW, LCSW [facility] Care Management [email or web address] Phone: [phone] Fax: [phone]

## 2015-08-24 ENCOUNTER — Other Ambulatory Visit: Payer: Self-pay | Admitting: Licensed Clinical Social Worker

## 2015-08-24 NOTE — Patient Outreach (Signed)
[facility]) Care Management  08/24/2015  Craig Bennett 07/24/1929 [id number]   Assessment- CSW contacted DSS to make referral for the Alzheimer's disease prioritization program for CAP-DA. CSW provided patient information and application will be mailed to CSW office. CSW then will take application to patient and discuss with friend and patient to see if they wish to be on waiting list for program.  Plan-CSW will follow up with patient and SNF discharge planning next week.  Craig Bennett, BSW, MSW, LCSW [facility] Care Management [email or web address] Phone: [phone] Fax: [phone]

## 2015-08-28 ENCOUNTER — Other Ambulatory Visit: Payer: Self-pay | Admitting: Licensed Clinical Social Worker

## 2015-08-28 NOTE — Patient Outreach (Signed)
[facility]) Care Management  08/28/2015  Craig Bennett 06/03/1929 [id number]   Assessment- CSW received incoming call from Cardell PeachVicki Vanstory. Chip BoerVicki stated that she contacted APS caseworker and was informed that the results of the mini mental have not been received yet. Chip BoerVicki states that she was informed by Darla LeschesJim Plyler that his apartment was cleaned over the weekend (concerns about safety and hazards due to apartment conditions.)   CSW completed outreach to [place], discharge planner at [facility] but was unable to reach her. HIPPA compliant voice message was left requesting return call.  CSW then received an additional call from Ampere NorthVicki who had Darla LeschesJim Plyler in her office. [name] states that he talked to APS caseworker and he was directed to go to DSS and apply for Special Assistance Medicaid for patient. [name] is confused on or if he the needs to do this until patient makes decision on whether or not he is willing to stay at a long term stay facility. CSW completed call to APS case worker who shares that the findings of mini mental were that patient is "impaired" and that they will not be able to petition the court until it is decided if patient is going to reside at long term facility or not. She also shares that [name] can apply for Special Assistance Medicaid for patient as she has already discussed case with the Medicaid caseworker. APS caseworker shares that case decision has not been made until patient makes decision on long term placement which should take place soon. She shares that she has talked to [facility] as well. CSW completed call back to Chip BoerVicki who was with Darla LeschesJim Plyler. [name] Plyler will complete visits of various assisted living facilities: [facility], [facility] and [facility]. [name] Plyler states that [facility] see [facility]. [name] Plyler, Cardell PeachVicki Vanstory, APS caseworker and CSW will await for decision from patient in regards to his decision on long term stay placement. CSW  unsure on when SNF d/c date is set now after receiving these updates.   Plan-CSW will continue to follow case. CSW will await to hear back from SNF d/c planner.   [name] Keshun Berrett, BSW, MSW, LCSW [facility] Care Management [email or web address] Phone: [phone] Fax: [phone]

## 2015-08-30 ENCOUNTER — Other Ambulatory Visit: Payer: Self-pay | Admitting: Licensed Clinical Social Worker

## 2015-08-30 NOTE — Patient Outreach (Signed)
[facility]) Care Management  08/30/2015  Craig Bennett 05/24/1929 [id number]   Assessment- CSW received incoming call from patient's friend Craig Bennett. [name] shares that SNF d/c planner informed him that patient can stay until Monday and if he decides to transition to long term care then he will do so next week. [name] also shares that he went to DSS and applied for Special Assistance Medicaid for patient. [name] reports that [facility] to talk [facility] patient yesterday but they informed him that he did not qualify for ALF placement due to his ability to do most ALD's and IADL'S. [name] shares that he was [facility] but then was informed that the location he was going to did not accept Medicaid. CSW provided [facility] location which has been confirmed to accept Medicaid. CSW provided address and contact information. CSW informed friend that she would be completing SNF visit today to see patient.  Plan-CSW will complete SNF visit today.  Craig Bennett, BSW, MSW, LCSW [facility] Care Management [email or web address] Phone: [phone] Fax: [phone]

## 2015-08-30 NOTE — Patient Outreach (Signed)
[facility]) Care Management  [facility] Social Work  08/30/2015  Craig Bennett [birth year] [id number]   Encounter Medications:  Outpatient Encounter Prescriptions as of 08/30/2015  Medication Sig  . acetaminophen (TYLENOL) 325 MG tablet Take 2 tablets (650 mg total) by mouth every 6 (six) hours as needed for mild pain (or Fever >/= 101).  Marland Kitchen aspirin 81 MG tablet Take 81 mg by mouth at bedtime.   Marland Kitchen atenolol (TENORMIN) 25 MG tablet Take 0.5 tablets (12.5 mg total) by mouth daily.  Marland Kitchen doxycycline (VIBRA-TABS) 100 MG tablet Take 1 tablet (100 mg total) by mouth 2 (two) times daily.  . memantine (NAMENDA) 5 MG tablet Take 1 tablet (5 mg total) by mouth daily.  . Multiple Vitamins-Minerals (MULTIVITAMIN WITH MINERALS) tablet Take 1 tablet by mouth at bedtime.   . Rivaroxaban (XARELTO) 15 MG TABS tablet Take 1 tablet (15 mg total) by mouth 2 (two) times daily with a meal.  . [START ON 08/31/2015] rivaroxaban (XARELTO) 20 MG TABS tablet Take 1 tablet (20 mg total) by mouth daily with supper.   No facility-administered encounter medications on file as of 08/30/2015.    Functional Status:  In your present state of health, do you have any difficulty performing the following activities: 08/08/2015 06/28/2015  Hearing? N Y  Vision? N Y  Difficulty concentrating or making decisions? [name]  Walking or climbing stairs? Y Y  Dressing or bathing? N Y  Doing errands, shopping? Y Y  Preparing Food and eating ? - -  Using the Toilet? - -  In the past six months, have you accidently leaked urine? - -  Do you have problems with loss of bowel control? - -  Managing your Medications? - -  Managing your Finances? - -  Housekeeping or managing your Housekeeping? - -    Fall/Depression Screening:  PHQ 2/9 Scores 08/16/2015 06/28/2015 06/18/2015 05/16/2015 01/08/2015 12/06/2014  PHQ - 2 Score 0 0 0 0 0 0    Assessment: CSW completed SNF visit at [facility]. CSW met with D/c planner first. She reports that  patient is currently still in private room and they are waiting on Medicaid pending number in order to start his long term care. She reports some concerns on whether or not he is still willing to stay at facility but reports that he did not meet requirement for ALF placement. She reports that she has been in contact with patient's friend Milana Huntsman and he will be trying to find a way to sell his furniture in his room at [facility] and give what he makes back to patient. D/c planner wishes for CSW to remain involved for an additional few weeks to ensure that he is still agreeable to long term placement at facility. CSW went to patient's room. Patient was eating his lunch. Patient is aware that he is going to be placed at this facility long term. He is currently agreeable to this long term placement. Patient reports that he has been playing the piano "a little bit." Patient reports that he "is a little sad about having stay here." He shares that he is not happy about having to share a room. CSW reminded him that no one is currently in the room that he will share for now. CSW reports that he has been reading a lot during his stay at SNF.  Plan: CSW will complete next outreach to facility within one week.  [name], BSW, MSW, [place]  Management [email or web address] Phone: [phone] Fax: [phone]

## 2015-09-03 ENCOUNTER — Other Ambulatory Visit: Payer: Self-pay | Admitting: Licensed Clinical Social Worker

## 2015-09-03 NOTE — Patient Outreach (Signed)
[facility]) Care Management  09/03/2015  Craig Bennett 02/03/1930 [id number]   Assessment- CSW received call from [facility] Bennett. Craig shares that patient will be [facility] and they will be discussing plans on ending his residence there so that he can stay at [facility] long term. Craig had a few questions in regards to community resources that may benefit patient while he is a facility long term. Discussion on SCAT but concerns about patient's safety as he may get lost. Craig plans on talking to BB&T CorporationJim Bennett about transportation.  Plan-CSW will await for update. CSW will complete outreach in two weeks.  Craig Bennett, BSW, MSW, LCSW [facility] Care Management [email or web address] Phone: [phone] Fax: [phone]

## 2015-09-05 ENCOUNTER — Other Ambulatory Visit: Payer: Self-pay | Admitting: Licensed Clinical Social Worker

## 2015-09-05 NOTE — Patient Outreach (Signed)
[facility]) Care Management  09/05/2015  Seward MethJames D Perko 12/12/1929 [id number]   Assessment- CSW received call from [facility]. Patient's friend has questions in regards to Special Assistance Medicaid. [name] wishes to know if he sells patient's furniture (will reside at [facility]) can patient can keep the money that he earned from selling it. [name] has questions in regards to gaining too much money that will push him over the [facility] eligibility financial limit. CSW provided patient's Medicaid caseworker number for him to call and ask.  Plan-CSW will follow up with patient upon return from being out of the office all next week.  [name] Tarica Harl, BSW, MSW, LCSW [facility] Care Management [email or web address] Phone: [phone] Fax: [phone]

## 2015-09-19 ENCOUNTER — Ambulatory Visit: Payer: Self-pay | Admitting: Licensed Clinical Social Worker

## 2015-09-20 ENCOUNTER — Other Ambulatory Visit: Payer: Self-pay | Admitting: Licensed Clinical Social Worker

## 2015-09-20 NOTE — Patient Outreach (Signed)
[facility]) Care Management  09/20/2015  Craig Bennett 12/02/1929 [id number]   Assessment- CSW received call from Craig Bennett (friend of patient, consent signed.) Craig Bennett reports that patient's daughter is in town from [place] and that daughter is wishing to gain legal guardianship. Craig Bennett has contacted APS social worker and they are unable to help. Craig Bennett reports that they have an appointment at DSS this morning and that they are in need of the paragraph that was typed out by PCP. CSW only has the payee representative form but does not have the letter. CSW completed search of On[employer] and cannot find form. CSW contacted PCP office and left a message with medical records and the nurse. CSW will continue to follow case and receive updates.  Plan-CSW will continue to provide social work services to patient and family.  Craig Bennett Craig Bennett, BSW, MSW, LCSW [facility] Care Management [email or web address] Phone: [phone] Fax: [phone]

## 2015-09-20 NOTE — Patient Outreach (Signed)
[facility]) Care Management  09/20/2015  Craig MethJames D Bennett 05/18/1929 [id number]   Assessment- [facility] staff were able to retrieve letter from PCP. CSW completed call to friend to update him. Craig AshingJim and patient's daughter will pick up this letter at [facility] front desk.  Plan-CSW will continue to follow case and provide social work assistance.  Craig Bennett Ebony Bennett, BSW, MSW, LCSW [facility] Care Management [email or web address] Phone: [phone] Fax: [phone]

## 2015-09-21 ENCOUNTER — Ambulatory Visit: Payer: Self-pay | Admitting: Licensed Clinical Social Worker

## 2015-09-28 ENCOUNTER — Other Ambulatory Visit: Payer: Self-pay | Admitting: Licensed Clinical Social Worker

## 2015-09-28 NOTE — Patient Outreach (Signed)
[facility]) Care Management  09/28/2015  SHRAY HUNLEY May 05, 1929 [id number]   Assessment- CSW completed outreach to patient's friend [name] today and he answered successfully. [name] stated that he met with the [place] representative at [employer] and patient's daughter (who is back in the country to visit her family) and that they found out that patient is NOT eligible for any pensions (which would help patient stay at a private room at facility) due to the time her served. However, they are waiting to hear back on other benefits he can be eligible for. Patient was moved into a different room that he will share with some at [facility]. [name] reports that he was able to clean out his apartment, sell his furniture for him and turn in his keys. Friend states that patient's daughter visited patient at [facility] and brought his grandson which he has only met once before. This was a positive experience or patient. CSW questioned if they had met with [facility] [place] yet and he states that they will meet with him next week in order to discuss options and complete HCPOA. However, daughter did not agree to being patient's legal guardian.   Plan-CSW will follow up in two weeks and continue to provide social work services and resources.  [name], BSW, MSW, [email or web address] Phone: [phone] Fax: [phone]

## 2015-10-01 ENCOUNTER — Other Ambulatory Visit: Payer: Self-pay | Admitting: Licensed Clinical Social Worker

## 2015-10-01 NOTE — Patient Outreach (Signed)
[facility]N) Care Management  10/01/2015  Craig MethJames D Bennett 01/15/1930 [id number]   Assessment- CSW completed outreach to patient's friend on 10/01/15 in order to schedule SNF visit. Patient's friend answered and is agreeable to SNF visit on 10/04/15 at 2:15 am after their appointment with [facility] [place].  Plan-CSW will complete SNF visit on 10/04/15.  Craig Bennett Craig Bennett, BSW, MSW, LCSW [facility] Care Management [email or web address] Phone: [phone] Fax: [phone]

## 2015-10-04 ENCOUNTER — Other Ambulatory Visit: Payer: Self-pay | Admitting: Licensed Clinical Social Worker

## 2015-10-04 NOTE — Patient Outreach (Signed)
[facility]N) Care Management  [facility] Social Work  10/04/2015  Seward MethJames D Pozo 04/14/1929 [id number]   Encounter Medications:  Outpatient Encounter Prescriptions as of 10/04/2015  Medication Sig  . acetaminophen (TYLENOL) 325 MG tablet Take 2 tablets (650 mg total) by mouth every 6 (six) hours as needed for mild pain (or Fever >/= 101).  Marland Kitchen. aspirin 81 MG tablet Take 81 mg by mouth at bedtime.   Marland Kitchen. atenolol (TENORMIN) 25 MG tablet Take 0.5 tablets (12.5 mg total) by mouth daily.  Marland Kitchen. doxycycline (VIBRA-TABS) 100 MG tablet Take 1 tablet (100 mg total) by mouth 2 (two) times daily.  . memantine (NAMENDA) 5 MG tablet Take 1 tablet (5 mg total) by mouth daily.  . Multiple Vitamins-Minerals (MULTIVITAMIN WITH MINERALS) tablet Take 1 tablet by mouth at bedtime.   . Rivaroxaban (XARELTO) 15 MG TABS tablet Take 1 tablet (15 mg total) by mouth 2 (two) times daily with a meal.  . rivaroxaban (XARELTO) 20 MG TABS tablet Take 1 tablet (20 mg total) by mouth daily with supper.   No facility-administered encounter medications on file as of 10/04/2015.    Functional Status:  In your present state of health, do you have any difficulty performing the following activities: 10/04/2015 08/08/2015  Hearing? Y N  Vision? Y N  Difficulty concentrating or making decisions? Malvin JohnsY Y  Walking or climbing stairs? N Y  Dressing or bathing? N N  Doing errands, shopping? Y Y  Preparing Food and eating ? - -  In the past six months, have you accidently leaked urine? - -  Do you have problems with loss of bowel control? - -  Managing your Medications? - -  Managing your Finances? - -  Housekeeping or managing your Housekeeping? - -    Fall/Depression Screening:  PHQ 2/9 Scores 10/04/2015 08/16/2015 06/28/2015 06/18/2015 05/16/2015 01/08/2015 12/06/2014  PHQ - 2 Score 0 0 0 0 0 0 0    Assessment: CSW completed SNF visit on 10/04/15 with patient and patient's close friend [employer] Plyler. Patient seems to be in better spirits  than last SNF visit. Patient denies being in any pain or discomfort. Patient denies any depressive symptoms. Patient reports some struggles with constipation. Patient reports that he is sleeping well. Patient has a healthy appetite. Patient now is living in a room with another resident in a joint room at [facility]. Patient reports that he has asked the maintenance worker to assist him with hanging up several pictures in his room to make it feel more like home. Patient reports that he has played the piano a few times at facility which he enjoys. Patient states that he goes to the recreation center and completes several exercises daily. Patient denies making any friends at facility. Patient denies any interest in participating in any of the social activities but once CSW reviewed his social calendar with patient, he did express interest in the music activities. Patient has had a few friends come visit him and his family members from [place] a few weeks ago. Patient reports that he missed his scheduled dentist appointment ( a dentist comes to facility monthly) and needs to reschedule appointment.  [name] and patient went to [employer]. [name] will need to contact a number to provide general information on patient before completing advance directive and living will. Patient and friend were educated on program services. Patient's friend Debbrah AlarMargaret Griffin will be his HCPOA. MOST form was completed at [facility]. Friend and patient also went  to see a Photographer[employer]eteran specialist at [employer] and found out he would not be eligible for a pension but would be eligible for certain burial benefits. Friend had a few questions in regards to a bill he received. CSW showed him where the billing department was who provided education on this matter.   Plan: CSW will follow up on three weeks and continue to provide social work assistance during this transition.  [name] Roberts Bon, BSW, MSW, LCSW [facility] Care  Management [email or web address] Phone: [phone] Fax: [phone]

## 2015-10-12 ENCOUNTER — Other Ambulatory Visit: Payer: Self-pay | Admitting: Licensed Clinical Social Worker

## 2015-10-12 NOTE — Patient Outreach (Signed)
[facility]N) Care Management  10/12/2015  Craig MethJames D Belger 01/19/1930 [id number]   Assessment- CSW received incoming email from patient's friend Craig Bennett. Craig Bennett questions if CSW has a completed FL2 form for patient as patient's Medicaid caseworker is requesting it. CSW informed him that CSW never had a FL2 completed and [facility]. CSW informed friend if he needed any assistance with this then she can gladly help.  Plan-CSW will continue to follow case and provide social work assistance.  Craig Bennett, BSW, MSW, LCSW [facility] Care Management [email or web address] Phone: [phone] Fax: [phone]

## 2015-10-15 ENCOUNTER — Other Ambulatory Visit: Payer: Self-pay | Admitting: Licensed Clinical Social Worker

## 2015-10-15 DIAGNOSIS — I739 Peripheral vascular disease, unspecified: Secondary | ICD-10-CM | POA: Diagnosis not present

## 2015-10-15 DIAGNOSIS — B351 Tinea unguium: Secondary | ICD-10-CM | POA: Diagnosis not present

## 2015-10-15 DIAGNOSIS — M79672 Pain in left foot: Secondary | ICD-10-CM | POA: Diagnosis not present

## 2015-10-15 DIAGNOSIS — M79671 Pain in right foot: Secondary | ICD-10-CM | POA: Diagnosis not present

## 2015-10-15 NOTE — Patient Outreach (Signed)
[facility]N) Care Management  10/15/2015  Seward MethJames D Sterry 09/01/1929 [id number]   Assessment- CSW did not receive return email from patient's close friend and completed outreach to follow up on social work needs. CSW unable to reach patient's friend successfully but left a HIPPA compliant voice message encouraging return call.  Plan-CSW will continue to follow case even though patient has been successfully placed at LTC facility due to lack of overall support and ongoing social work needs.  [name] Bethany Cumming, BSW, MSW, LCSW [facility] Care Management [email or web address] Phone: [phone] Fax: [phone]

## 2015-10-19 DIAGNOSIS — N390 Urinary tract infection, site not specified: Secondary | ICD-10-CM | POA: Diagnosis not present

## 2015-10-19 DIAGNOSIS — Z79899 Other long term (current) drug therapy: Secondary | ICD-10-CM | POA: Diagnosis not present

## 2015-10-19 DIAGNOSIS — R319 Hematuria, unspecified: Secondary | ICD-10-CM | POA: Diagnosis not present

## 2015-10-23 ENCOUNTER — Encounter (HOSPITAL_COMMUNITY): Payer: Self-pay

## 2015-10-23 ENCOUNTER — Emergency Department (HOSPITAL_COMMUNITY)
Admission: EM | Admit: 2015-10-23 | Discharge: 2015-10-23 | Disposition: A | Discharge status: Home / Self Care | Payer: Medicare PPO | Attending: Emergency Medicine | Admitting: Emergency Medicine

## 2015-10-23 ENCOUNTER — Other Ambulatory Visit: Payer: Self-pay | Admitting: Licensed Clinical Social Worker

## 2015-10-23 DIAGNOSIS — I739 Peripheral vascular disease, unspecified: Secondary | ICD-10-CM | POA: Diagnosis not present

## 2015-10-23 DIAGNOSIS — Z7982 Long term (current) use of aspirin: Secondary | ICD-10-CM | POA: Diagnosis not present

## 2015-10-23 DIAGNOSIS — Z8546 Personal history of malignant neoplasm of prostate: Secondary | ICD-10-CM | POA: Diagnosis not present

## 2015-10-23 DIAGNOSIS — Z79899 Other long term (current) drug therapy: Secondary | ICD-10-CM | POA: Insufficient documentation

## 2015-10-23 DIAGNOSIS — I5032 Chronic diastolic (congestive) heart failure: Secondary | ICD-10-CM | POA: Insufficient documentation

## 2015-10-23 DIAGNOSIS — J449 Chronic obstructive pulmonary disease, unspecified: Secondary | ICD-10-CM | POA: Diagnosis not present

## 2015-10-23 DIAGNOSIS — I4892 Unspecified atrial flutter: Secondary | ICD-10-CM | POA: Insufficient documentation

## 2015-10-23 DIAGNOSIS — N183 Chronic kidney disease, stage 3 (moderate): Secondary | ICD-10-CM | POA: Diagnosis not present

## 2015-10-23 DIAGNOSIS — Z8679 Personal history of other diseases of the circulatory system: Secondary | ICD-10-CM | POA: Insufficient documentation

## 2015-10-23 DIAGNOSIS — I998 Other disorder of circulatory system: Secondary | ICD-10-CM | POA: Diagnosis present

## 2015-10-23 HISTORY — DX: Other pulmonary embolism without acute cor pulmonale: I26.99

## 2015-10-23 NOTE — ED Provider Notes (Signed)
Patient is asymptomatic except here from skilled nursing facility with concern for poor circulation in his legs. No treatment prior to coming here. On exam patient is alert and in no distress. Heart regular rate and rhythm lungs clear auscultation abdomen nondistended nontender bilateral lower extremities with brawny skin changes about shins and calves DP pulses 1+ bilaterally PT pulses 1+ bilaterally femoral pulses 2+ bilaterally. Good capillary refill. There is no tenderness of calves or thighs. Assessment patient likely has chronic arterial insufficiency as corroborated by skin changes. No intervention or diagnostic testing needed since he is asymptomatic.   Doug Sou, MD 10/23/15 1323

## 2015-10-23 NOTE — ED Triage Notes (Addendum)
PT RECEIVED FROM [facility] VIA EMS SENT FOR POSSIBLE POOR CIRCULATION OF THE LOWER LEGS. PER THE FACILITY, THE PT WAS DX'D WITH A UTI ON 7/21 AND STARTED ON CIPRO ON 7/22. ON 7/23 THEY NOTICED A DARK COLOR TO THE BILATERAL TOES. CIPRO 500MG  BID WAS STOPPED ON 7/23 AND HE STARTED ON MACROBID 100MG  BID. PT CURRENTLY ON XARELTO FOR A PE. PT IN NO DISTRESS OR PAIN AT THIS TIME.

## 2015-10-23 NOTE — ED Notes (Signed)
ATTEMPTED TO CONTACT [facility] FOR PT'S RETURN BACK TO THE FACILITY BY HIS FRIEND [name] PLYLER.

## 2015-10-23 NOTE — Discharge Instructions (Signed)
Your exam was reassuring. You have good blood flow. Continue all of your medications as prescribed. Please follow up with Drs. Donette Larry and [place] as soon as possible. Return to the emergency room for new or worsening symptoms.

## 2015-10-23 NOTE — Patient Outreach (Signed)
[facility]) Care Management  10/23/2015  Craig Bennett Dec 30, 1929 [id number]   Assessment- CSW received phone call from patient's close friend [name] (consent signed.) Friend states that he received a call from [facility] stating that they were transporting him to ED because his feet were "blue" and had poor circulation in his legs. Friend wishes to find out which ED so that he can go there to be with patient. CSW informed friend that patient went to [facility] ED. Patient's friend will keep CSW updated.   CSW completed chart review later in the day and patient was discharged from ED.  Plan-CSW will follow up with patient in two weeks.  [name], BSW, MSW, LCSW [email or web address] Phone: [phone] Fax: [phone]

## 2015-10-23 NOTE — ED Provider Notes (Signed)
[facility]-EMERGENCY DEPT Provider Note   CSN: [id number] Arrival date & time: 10/23/15  1116  First Provider Contact:  First MD Initiated Contact with Patient 10/23/15 1234     History   Chief Complaint Chief Complaint  Patient presents with  . Poor Circulation    BILATERAL LOWER LEGS   HPI  Craig Bennett is an 82 y.o. male with history of afib, PE (now on Xarelto), CHF, COPD, who presents to the ED for evaluation of possible poor circulation in his feet. He was sent to the ED from his assisted living facility because the nursing staff felt like his feet were blue and they could not palpate good DP pulses, particularly in the right foot. Pt has no complaints at this time. He states he has no pain and has not noticed any new swelling. He has been taking all of his medications as prescribed.  Past Medical History:  Diagnosis Date  . A-fib (HCC)    a. CHA2DS2VASc = 2, prev on coumadin but d/c'd ~ 07/2014 - told he was in sinus rhythm;  b. 09/2014 amio d/c'd in setting of persistent afib and bradycardia  . Chronic diastolic CHF (congestive heart failure) (HCC)   . CKD (chronic kidney disease), stage II   . COPD (chronic obstructive pulmonary disease) (HCC)   . Dementia    a. 09/2014 Aricept d/c'd in setting of bradycardia.  Marland Kitchen History of echocardiogram    a. 11/2008 Echo: EF 60-65%, no rwma, mild LVH, triv AI, mild MR, mildly dil LA/RA.  Marland Kitchen Low testosterone   . PE (pulmonary embolism)   . Renal insufficiency   . Syncope    a. 09/2014    Patient Active Problem List   Diagnosis Date Noted  . Pulmonary embolus (HCC) 08/06/2015  . AKI (acute kidney injury) (HCC) 10/24/2014  . Atrial fibrillation, unspecified   . Syncope and collapse   . Syncope 10/22/2014  . Dementia 10/22/2014  . Chronic atrial fibrillation (HCC) 10/22/2014  . Orthostatic hypotension 10/22/2014  . Symptomatic bradycardia 10/22/2014  . Low testosterone 10/22/2014  . CKD (chronic kidney disease), stage III 10/22/2014  .  H/O prostate cancer 10/22/2014    History reviewed. No pertinent surgical history.     Home Medications    Prior to Admission medications   Medication Sig Start Date End Date Taking? Authorizing Provider  acetaminophen (TYLENOL) 325 MG tablet Take 2 tablets (650 mg total) by mouth every 6 (six) hours as needed for mild pain (or Fever >/= 101). 08/10/15   Alison Murray, MD  aspirin 81 MG tablet Take 81 mg by mouth at bedtime.     Historical Provider, MD  atenolol (TENORMIN) 25 MG tablet Take 0.5 tablets (12.5 mg total) by mouth daily. 08/10/15   Alison Murray, MD  doxycycline (VIBRA-TABS) 100 MG tablet Take 1 tablet (100 mg total) by mouth 2 (two) times daily. 08/10/15   Alison Murray, MD  memantine (NAMENDA) 5 MG tablet Take 1 tablet (5 mg total) by mouth daily. 10/25/14   Marinda Elk, MD  Multiple Vitamins-Minerals (MULTIVITAMIN WITH MINERALS) tablet Take 1 tablet by mouth at bedtime.     Historical Provider, MD  Rivaroxaban (XARELTO) 15 MG TABS tablet Take 1 tablet (15 mg total) by mouth 2 (two) times daily with a meal. 08/10/15 08/31/15  Alison Murray, MD  rivaroxaban (XARELTO) 20 MG TABS tablet Take 1 tablet (20 mg total) by mouth daily with supper. 08/31/15   Alison Murray,  MD    Family History Family History  Problem Relation Age of Onset  . Other      no premature CAD.    Social History Social History  Substance Use Topics  . Smoking status: Never Smoker  . Smokeless tobacco: Never Used  . Alcohol use Yes     Comment: "very little, occasional wine with food."     Allergies   Review of patient's allergies indicates no known allergies.   Review of Systems Review of Systems 10 Systems reviewed and are negative for acute change except as noted in the HPI.   Physical Exam Updated Vital Signs BP 176/96 (BP Location: Left Arm)   Pulse 67   Temp 97.8 F (36.6 C) (Oral)   Resp 18   Ht 6' (1.829 m)   Wt 77.1 kg   SpO2 98%   BMI 23.06 kg/m   Physical Exam    Constitutional: He is oriented to person, place, and time.  HENT:  Head: Atraumatic.  Eyes: Conjunctivae are normal.  Neck: Neck supple.  Cardiovascular: Normal rate, regular rhythm, normal heart sounds and intact distal pulses.   Pulmonary/Chest: Effort normal and breath sounds normal. No respiratory distress. He has no wheezes.  Abdominal: He exhibits no distension.  Musculoskeletal:  Trace pitting edema bilateral LE foot through shin 2+ DP and TP pulses bilaterally No leg or foot tenderness  Neurological: He is alert and oriented to person, place, and time. No cranial nerve deficit.  Skin: Skin is warm and dry.  Chronic venous stasis skin changes bilateral legs Cap refill not brisk but certainly within normal limits  Psychiatric: He has a normal mood and affect.  Nursing note and vitals reviewed.    ED Treatments / Results  Labs (all labs ordered are listed, but only abnormal results are displayed) Labs Reviewed - No data to display  EKG  EKG Interpretation None       Radiology No results found.  Procedures Procedures (including critical care time)  Medications Ordered in ED Medications - No data to display   Initial Impression / Assessment and Plan / ED Course  I have reviewed the triage vital signs and the nursing notes.  Pertinent labs & imaging results that were available during my care of the patient were reviewed by me and considered in my medical decision making (see chart for details).  Pt seen in conjunction with attending Dr. Ethelda Chick. Pt certainly has some chronic vascular insufficiency but no evidence for acute arterial emergency. Pt compliant with xarelto. Doubt new DVT. Pt can be worked up as an outpatient by his pcp with referral to vascular surgery if appropriate. ER return precautions given.  Final Clinical Impressions(s) / ED Diagnoses   Final diagnoses:  Peripheral vascular insufficiency [facility])    New Prescriptions Discharge Medication  List as of 10/23/2015  1:39 PM       Carlene Coria, PA-C 10/24/15 1127    Doug Sou, MD 10/26/15 1735

## 2015-10-23 NOTE — ED Notes (Signed)
PT DISCHARGED. INSTRUCTIONS GIVEN TO [name] PLYER HIS FRIEND. AAOX3. PT IN NO APPARENT DISTRESS OR PAIN. THE OPPORTUNITY TO ASK QUESTIONS WAS PROVIDED.

## 2015-11-01 ENCOUNTER — Encounter: Payer: Self-pay | Admitting: Licensed Clinical Social Worker

## 2015-11-01 ENCOUNTER — Other Ambulatory Visit: Payer: Self-pay | Admitting: Licensed Clinical Social Worker

## 2015-11-01 NOTE — Patient Outreach (Signed)
[facility]) Care Management  [facility] Social Work  11/01/2015  Craig Bennett [birth year] [id number]  Encounter Medications:  Outpatient Encounter Prescriptions as of 11/01/2015  Medication Sig  . acetaminophen (TYLENOL) 325 MG tablet Take 2 tablets (650 mg total) by mouth every 6 (six) hours as needed for mild pain (or Fever >/= 101).  Marland Kitchen aspirin 81 MG tablet Take 81 mg by mouth at bedtime.   Marland Kitchen atenolol (TENORMIN) 25 MG tablet Take 0.5 tablets (12.5 mg total) by mouth daily.  Marland Kitchen doxycycline (VIBRA-TABS) 100 MG tablet Take 1 tablet (100 mg total) by mouth 2 (two) times daily.  . memantine (NAMENDA) 5 MG tablet Take 1 tablet (5 mg total) by mouth daily.  . Multiple Vitamins-Minerals (MULTIVITAMIN WITH MINERALS) tablet Take 1 tablet by mouth at bedtime.   . Rivaroxaban (XARELTO) 15 MG TABS tablet Take 1 tablet (15 mg total) by mouth 2 (two) times daily with a meal.  . rivaroxaban (XARELTO) 20 MG TABS tablet Take 1 tablet (20 mg total) by mouth daily with supper.   No facility-administered encounter medications on file as of 11/01/2015.     Functional Status:  In your present state of health, do you have any difficulty performing the following activities: 10/04/2015 08/08/2015  Hearing? Y N  Vision? Y N  Difficulty concentrating or making decisions? [name]  Walking or climbing stairs? N Y  Dressing or bathing? N N  Doing errands, shopping? Y Y  Preparing Food and eating ? - -  Using the Toilet? - -  In the past six months, have you accidently leaked urine? - -  Do you have problems with loss of bowel control? - -  Managing your Medications? - -  Managing your Finances? - -  Housekeeping or managing your Housekeeping? - -  Some recent data might be hidden    Fall/Depression Screening:  PHQ 2/9 Scores 11/01/2015 10/04/2015 08/16/2015 06/28/2015 06/18/2015 05/16/2015 01/08/2015  PHQ - 2 Score 0 0 0 0 0 0 0    Assessment: CSW completed SNF visit on 11/01/15. Patient was in his room  watching television when CSW arrived. Patient states that he is doing well and still trying to adjust to staying at facility long term. Patient shares that he is still exercising daily. Patient went to ED recently due to poor circulation in his feet but they discharged him shortly after because they found everything to be normal. Patient shares that he continues to go to church daily with his friends. He reports that his appetite is normal as well as his sleep routine. He shares that someone bought him some new shirts but he is unsure as to who did this for him as he was in need of clothes. Patient has met with both [place] [facility] [employer] and [place]. However, patient and friend will need to follow up with services appropriately. Patient denies any pain. CSW educates patient on [facility] discharge as there are no further needs as he has not successfully been transitioned from SNF to long term care at [facility]. Patient continues to do well with this transition as he is now able to eat 3 full meals per day, take his medications as prescribed and receive nursing assistance. CSW contacted patient's friend [name] but was unable to reach him but left a HIPPA compliant voice message. Patient is agreeable to this discharge. CSW provided contact information to patient in case he had any further questions.   Plan: CSW will inform patient's PCP  and send case closure to [facility] care management assistance. CSW will await for patient's friend to return call to inform him of discharge.  [facility] CM Care Plan Problem One   Flowsheet Row Most Recent Value  [facility] CM Short Term Goal #3 (0-30 days)  Patient will complete appointment with [facility] [place] within 30 days  [facility] CM Short Term Goal #3 Start Date  10/01/15  [facility] CM Short Term Goal #3 Met Date  11/01/15  Interventions for Short Tern Goal #3  Goal met.  [facility] CM Short Term Goal #4 (0-30 days)  Patient will find out what all benefits he qualifies for within [employer]  program within 30 days  [facility] CM Short Term Goal #4 Start Date  10/01/15  [facility] CM Short Term Goal #4 Met Date  11/01/15  Interventions for Short Term Goal #4  Goal met.     [name], BSW, MSW, [email or web address] Phone: [phone] Fax: [phone]

## 2015-12-20 ENCOUNTER — Emergency Department (HOSPITAL_COMMUNITY): Payer: Medicare PPO | Admitting: Anesthesiology

## 2015-12-20 ENCOUNTER — Encounter (HOSPITAL_COMMUNITY): Payer: Self-pay | Admitting: Emergency Medicine

## 2015-12-20 ENCOUNTER — Encounter (HOSPITAL_COMMUNITY)
Admission: EM | Disposition: A | Discharge status: Skilled Nursing Facility | Payer: Self-pay | Source: Home / Self Care | Attending: Emergency Medicine

## 2015-12-20 ENCOUNTER — Ambulatory Visit (HOSPITAL_COMMUNITY)
Admission: EM | Admit: 2015-12-20 | Discharge: 2015-12-20 | Disposition: A | Discharge status: Skilled Nursing Facility | Payer: Medicare PPO | Attending: Emergency Medicine | Admitting: Emergency Medicine

## 2015-12-20 DIAGNOSIS — R040 Epistaxis: Secondary | ICD-10-CM | POA: Diagnosis not present

## 2015-12-20 DIAGNOSIS — I482 Chronic atrial fibrillation: Secondary | ICD-10-CM | POA: Diagnosis not present

## 2015-12-20 DIAGNOSIS — Z86711 Personal history of pulmonary embolism: Secondary | ICD-10-CM | POA: Insufficient documentation

## 2015-12-20 DIAGNOSIS — I129 Hypertensive chronic kidney disease with stage 1 through stage 4 chronic kidney disease, or unspecified chronic kidney disease: Secondary | ICD-10-CM | POA: Insufficient documentation

## 2015-12-20 DIAGNOSIS — Z79899 Other long term (current) drug therapy: Secondary | ICD-10-CM | POA: Diagnosis not present

## 2015-12-20 DIAGNOSIS — Z7982 Long term (current) use of aspirin: Secondary | ICD-10-CM | POA: Insufficient documentation

## 2015-12-20 DIAGNOSIS — F0390 Unspecified dementia without behavioral disturbance: Secondary | ICD-10-CM | POA: Diagnosis not present

## 2015-12-20 DIAGNOSIS — I272 Other secondary pulmonary hypertension: Secondary | ICD-10-CM | POA: Diagnosis not present

## 2015-12-20 DIAGNOSIS — I481 Persistent atrial fibrillation: Secondary | ICD-10-CM | POA: Insufficient documentation

## 2015-12-20 DIAGNOSIS — Z8546 Personal history of malignant neoplasm of prostate: Secondary | ICD-10-CM | POA: Insufficient documentation

## 2015-12-20 DIAGNOSIS — Z7901 Long term (current) use of anticoagulants: Secondary | ICD-10-CM | POA: Insufficient documentation

## 2015-12-20 DIAGNOSIS — J449 Chronic obstructive pulmonary disease, unspecified: Secondary | ICD-10-CM | POA: Insufficient documentation

## 2015-12-20 DIAGNOSIS — N183 Chronic kidney disease, stage 3 (moderate): Secondary | ICD-10-CM | POA: Insufficient documentation

## 2015-12-20 HISTORY — PX: NASAL HEMORRHAGE CONTROL: SHX287

## 2015-12-20 LAB — PROTIME-INR
INR: 1.4
PROTHROMBIN TIME: 17.3 s — AB (ref 11.4–15.2)

## 2015-12-20 LAB — BASIC METABOLIC PANEL
Anion gap: 5 (ref 5–15)
BUN: 34 mg/dL — AB (ref 6–20)
CHLORIDE: 106 mmol/L (ref 101–111)
CO2: 28 mmol/L (ref 22–32)
CREATININE: 1.02 mg/dL (ref 0.61–1.24)
Calcium: 9.5 mg/dL (ref 8.9–10.3)
Glucose, Bld: 95 mg/dL (ref 65–99)
POTASSIUM: 4.5 mmol/L (ref 3.5–5.1)
SODIUM: 139 mmol/L (ref 135–145)

## 2015-12-20 LAB — CBC
HCT: 43.9 % (ref 39.0–52.0)
HEMOGLOBIN: 14.3 g/dL (ref 13.0–17.0)
MCH: 31.4 pg (ref 26.0–34.0)
MCHC: 32.6 g/dL (ref 30.0–36.0)
MCV: 96.5 fL (ref 78.0–100.0)
PLATELETS: 225 10*3/uL (ref 150–400)
RBC: 4.55 MIL/uL (ref 4.22–5.81)
RDW: 13.8 % (ref 11.5–15.5)
WBC: 5.6 10*3/uL (ref 4.0–10.5)

## 2015-12-20 SURGERY — CONTROL OF EPISTAXIS
Anesthesia: General | Site: Nose

## 2015-12-20 MED ORDER — SILVER NITRATE-POT NITRATE 75-25 % EX MISC
CUTANEOUS | Status: DC | PRN
  Administered 2015-12-20 (×2): 3

## 2015-12-20 MED ORDER — MIDAZOLAM HCL 2 MG/2ML IJ SOLN
INTRAMUSCULAR | Status: AC
  Filled 2015-12-20: qty 2

## 2015-12-20 MED ORDER — FENTANYL CITRATE (PF) 100 MCG/2ML IJ SOLN
INTRAMUSCULAR | Status: DC | PRN
  Administered 2015-12-20: 50 ug via INTRAVENOUS
  Administered 2015-12-20 (×2): 25 ug via INTRAVENOUS

## 2015-12-20 MED ORDER — CEFAZOLIN SODIUM-DEXTROSE 2-3 GM-% IV SOLR
INTRAVENOUS | Status: DC | PRN
  Administered 2015-12-20: 2 g via INTRAVENOUS

## 2015-12-20 MED ORDER — LABETALOL HCL 5 MG/ML IV SOLN
INTRAVENOUS | Status: DC | PRN
  Administered 2015-12-20: 10 mg via INTRAVENOUS
  Administered 2015-12-20: 2.5 mg via INTRAVENOUS
  Administered 2015-12-20: 5 mg via INTRAVENOUS

## 2015-12-20 MED ORDER — CEFAZOLIN SODIUM-DEXTROSE 2-4 GM/100ML-% IV SOLN
INTRAVENOUS | Status: AC
  Filled 2015-12-20: qty 100

## 2015-12-20 MED ORDER — ONDANSETRON HCL 4 MG/2ML IJ SOLN
INTRAMUSCULAR | Status: DC | PRN
  Administered 2015-12-20: 4 mg via INTRAVENOUS

## 2015-12-20 MED ORDER — FENTANYL CITRATE (PF) 100 MCG/2ML IJ SOLN
INTRAMUSCULAR | Status: AC
  Filled 2015-12-20: qty 2

## 2015-12-20 MED ORDER — SILVER NITRATE-POT NITRATE 75-25 % EX MISC
1.0000 | Freq: Once | CUTANEOUS | Status: AC
  Administered 2015-12-20: 1 via TOPICAL
  Filled 2015-12-20: qty 1

## 2015-12-20 MED ORDER — LIDOCAINE 2% (20 MG/ML) 5 ML SYRINGE
INTRAMUSCULAR | Status: AC
Start: 2015-12-20 — End: 2015-12-20
  Filled 2015-12-20: qty 5

## 2015-12-20 MED ORDER — SUCCINYLCHOLINE CHLORIDE 20 MG/ML IJ SOLN
INTRAMUSCULAR | Status: DC | PRN
  Administered 2015-12-20: 100 mg via INTRAVENOUS

## 2015-12-20 MED ORDER — DEXAMETHASONE SODIUM PHOSPHATE 10 MG/ML IJ SOLN
INTRAMUSCULAR | Status: DC | PRN
  Administered 2015-12-20: 10 mg via INTRAVENOUS

## 2015-12-20 MED ORDER — PHENYLEPHRINE 40 MCG/ML (10ML) SYRINGE FOR IV PUSH (FOR BLOOD PRESSURE SUPPORT)
PREFILLED_SYRINGE | INTRAVENOUS | Status: AC
Start: 2015-12-20 — End: 2015-12-20
  Filled 2015-12-20: qty 10

## 2015-12-20 MED ORDER — LACTATED RINGERS IV SOLN
INTRAVENOUS | Status: DC | PRN
  Administered 2015-12-20 (×2): via INTRAVENOUS

## 2015-12-20 MED ORDER — BACITRACIN ZINC 500 UNIT/GM EX OINT
TOPICAL_OINTMENT | CUTANEOUS | Status: AC
  Filled 2015-12-20: qty 28.35

## 2015-12-20 MED ORDER — SILVER NITRATE-POT NITRATE 75-25 % EX MISC
1.0000 "application " | CUTANEOUS | Status: DC
  Filled 2015-12-20: qty 1

## 2015-12-20 MED ORDER — BACITRACIN ZINC 500 UNIT/GM EX OINT
TOPICAL_OINTMENT | CUTANEOUS | Status: AC
  Administered 2015-12-20: 1
  Filled 2015-12-20: qty 0.9

## 2015-12-20 MED ORDER — OXYMETAZOLINE HCL 0.05 % NA SOLN
NASAL | Status: AC
  Filled 2015-12-20: qty 15

## 2015-12-20 MED ORDER — LIDOCAINE HCL 1 % IJ SOLN
INTRAMUSCULAR | Status: AC
  Filled 2015-12-20: qty 20

## 2015-12-20 MED ORDER — PROPOFOL 10 MG/ML IV BOLUS
INTRAVENOUS | Status: DC | PRN
Start: 2015-12-20 — End: 2015-12-20
  Administered 2015-12-20: 200 mg via INTRAVENOUS

## 2015-12-20 MED ORDER — LIDOCAINE HCL (CARDIAC) 20 MG/ML IV SOLN
INTRAVENOUS | Status: DC | PRN
  Administered 2015-12-20: 75 mg via INTRAVENOUS

## 2015-12-20 MED ORDER — PROPOFOL 10 MG/ML IV BOLUS
INTRAVENOUS | Status: AC
  Filled 2015-12-20: qty 20

## 2015-12-20 MED ORDER — OXYMETAZOLINE HCL 0.05 % NA SOLN
NASAL | Status: DC | PRN
  Administered 2015-12-20: 1

## 2015-12-20 MED ORDER — ONDANSETRON HCL 4 MG/2ML IJ SOLN
INTRAMUSCULAR | Status: AC
  Filled 2015-12-20: qty 2

## 2015-12-20 MED ORDER — FENTANYL CITRATE (PF) 100 MCG/2ML IJ SOLN: 25.0000 ug | INTRAMUSCULAR | Status: DC | PRN

## 2015-12-20 MED ORDER — OXYMETAZOLINE HCL 0.05 % NA SOLN
2.0000 | Freq: Once | NASAL | Status: AC
  Administered 2015-12-20: 2 via NASAL
  Filled 2015-12-20: qty 15

## 2015-12-20 MED ORDER — 0.9 % SODIUM CHLORIDE (POUR BTL) OPTIME
TOPICAL | Status: DC | PRN
  Administered 2015-12-20: 1000 mL

## 2015-12-20 MED ORDER — CEPHALEXIN 500 MG PO CAPS: 500.0000 mg | ORAL_CAPSULE | Freq: Two times a day (BID) | ORAL | 0 refills | Status: AC

## 2015-12-20 MED ORDER — DEXTROSE 5 % IV SOLN
INTRAVENOUS | Status: DC | PRN
  Administered 2015-12-20: 15 ug/min via INTRAVENOUS

## 2015-12-20 SURGICAL SUPPLY — 28 items
COAGULATOR SUCT 6 FR SWTCH (ELECTROSURGICAL) ×1
COAGULATOR SUCT SWTCH 10FR 6 (ELECTROSURGICAL) ×2 IMPLANT
DRAPE SHEET LG 3/4 BI-LAMINATE (DRAPES) ×3 IMPLANT
DRESSING NASAL POPE 10X1.5X2.5 (GAUZE/BANDAGES/DRESSINGS) ×1 IMPLANT
DRSG NASAL POPE 10X1.5X2.5 (GAUZE/BANDAGES/DRESSINGS) ×3
ELECT REM PT RETURN 9FT ADLT (ELECTROSURGICAL) ×3
ELECTRODE REM PT RTRN 9FT ADLT (ELECTROSURGICAL) ×1 IMPLANT
GAUZE SPONGE 4X4 12PLY STRL (GAUZE/BANDAGES/DRESSINGS) ×3 IMPLANT
GAUZE SPONGE 4X4 16PLY XRAY LF (GAUZE/BANDAGES/DRESSINGS) ×3 IMPLANT
GLOVE BIOGEL M 6.5 STRL (GLOVE) ×3 IMPLANT
GLOVE BIOGEL PI IND STRL 7.5 (GLOVE) ×1 IMPLANT
GLOVE BIOGEL PI INDICATOR 7.5 (GLOVE) ×2
GOWN STRL NON-REIN LRG LVL3 (GOWN DISPOSABLE) ×3 IMPLANT
GOWN STRL REUS W/ TWL XL LVL3 (GOWN DISPOSABLE) ×1 IMPLANT
GOWN STRL REUS W/TWL XL LVL3 (GOWN DISPOSABLE) ×2
HEMOSTAT SURGICEL 2X14 (HEMOSTASIS) ×3 IMPLANT
KIT BASIN OR (CUSTOM PROCEDURE TRAY) ×3 IMPLANT
NEEDLE HYPO 22GX1.5 SAFETY (NEEDLE) ×3 IMPLANT
PACK EENT SPLIT (PACKS) ×3 IMPLANT
PATTIES SURGICAL 1X1 (DISPOSABLE) ×3 IMPLANT
SOLUTION ANTI FOG 6CC (MISCELLANEOUS) ×3 IMPLANT
SPONGE LAP 18X18 X RAY DECT (DISPOSABLE) ×3 IMPLANT
SUCTION FRAZIER HANDLE 10FR (MISCELLANEOUS) ×2
SUCTION TUBE FRAZIER 10FR DISP (MISCELLANEOUS) ×1 IMPLANT
SYR CONTROL 10ML LL (SYRINGE) ×3 IMPLANT
TOWEL NATURAL 10PK STERILE (DISPOSABLE) ×3 IMPLANT
TOWEL OR NON WOVEN STRL DISP B (DISPOSABLE) ×3 IMPLANT
YANKAUER SUCT BULB TIP NO VENT (SUCTIONS) ×3 IMPLANT

## 2015-12-20 NOTE — ED Provider Notes (Signed)
[facility]-EMERGENCY DEPT Provider Note   CSN: [id number] Arrival date & time: 12/20/15  0856     History   Chief Complaint Chief Complaint  Patient presents with  . Epistaxis    HPI Craig Bennett is a 82 y.o. male with a past medical history significant for dementia, COPD, CK D, CHF, and history of A. fib and PE on Xarelto who presents with epistaxis. Patient sent by EMS from facility for epistaxis since 4 AM. Patient reports that improved after pressure however, it returned prompting evaluation. Patient denies fevers, chills, lightheadedness, dizziness, vision changes, nausea, vomiting, or any recent constipation, diarrhea or dysuria. Patient reports the bleeding has been intermittent but significant. Patient denies any recent nasal trauma and says he thinks it was because he picked his nose.   The history is provided by the patient and medical records. No language interpreter was used.  Epistaxis   This is a new problem. The current episode started 3 to 5 hours ago. The problem occurs constantly. The problem has not changed since onset.The problem is associated with anticoagulants (xarelto). The bleeding has been from the left nare. He has tried applying pressure for the symptoms. The treatment provided mild relief.    Past Medical History:  Diagnosis Date  . A-fib (HCC)    a. CHA2DS2VASc = 2, prev on coumadin but d/c'd ~ 07/2014 - told he was in sinus rhythm;  b. 09/2014 amio d/c'd in setting of persistent afib and bradycardia  . Chronic diastolic CHF (congestive heart failure) (HCC)   . CKD (chronic kidney disease), stage II   . COPD (chronic obstructive pulmonary disease) (HCC)   . Dementia    a. 09/2014 Aricept d/c'd in setting of bradycardia.  Marland Kitchen. History of echocardiogram    a. 11/2008 Echo: EF 60-65%, no rwma, mild LVH, triv AI, mild MR, mildly dil LA/RA.  Marland Kitchen. Low testosterone   . PE (pulmonary embolism)   . Renal insufficiency   . Syncope    a. 09/2014    Patient Active Problem  List   Diagnosis Date Noted  . Pulmonary embolus (HCC) 08/06/2015  . AKI (acute kidney injury) (HCC) 10/24/2014  . Atrial fibrillation, unspecified   . Syncope and collapse   . Syncope 10/22/2014  . Dementia 10/22/2014  . Chronic atrial fibrillation (HCC) 10/22/2014  . Orthostatic hypotension 10/22/2014  . Symptomatic bradycardia 10/22/2014  . Low testosterone 10/22/2014  . CKD (chronic kidney disease), stage III 10/22/2014  . H/O prostate cancer 10/22/2014    History reviewed. No pertinent surgical history.     Home Medications    Prior to Admission medications   Medication Sig Start Date End Date Taking? Authorizing Provider  acetaminophen (TYLENOL) 325 MG tablet Take 2 tablets (650 mg total) by mouth every 6 (six) hours as needed for mild pain (or Fever >/= 101). 08/10/15   Alison MurrayAlma M Devine, MD  aspirin 81 MG tablet Take 81 mg by mouth at bedtime.     Historical Provider, MD  atenolol (TENORMIN) 25 MG tablet Take 0.5 tablets (12.5 mg total) by mouth daily. 08/10/15   Alison MurrayAlma M Devine, MD  doxycycline (VIBRA-TABS) 100 MG tablet Take 1 tablet (100 mg total) by mouth 2 (two) times daily. 08/10/15   Alison MurrayAlma M Devine, MD  memantine (NAMENDA) 5 MG tablet Take 1 tablet (5 mg total) by mouth daily. 10/25/14   Marinda ElkAbraham Feliz Ortiz, MD  Multiple Vitamins-Minerals (MULTIVITAMIN WITH MINERALS) tablet Take 1 tablet by mouth at bedtime.  Historical Provider, MD  Rivaroxaban (XARELTO) 15 MG TABS tablet Take 1 tablet (15 mg total) by mouth 2 (two) times daily with a meal. 08/10/15 08/31/15  Alison Murray, MD  rivaroxaban (XARELTO) 20 MG TABS tablet Take 1 tablet (20 mg total) by mouth daily with supper. 08/31/15   Alison Murray, MD    Family History Family History  Problem Relation Age of Onset  . Other      no premature CAD.    Social History Social History  Substance Use Topics  . Smoking status: Never Smoker  . Smokeless tobacco: Never Used  . Alcohol use Yes     Comment: "very little,  occasional wine with food."     Allergies   Review of patient's allergies indicates no known allergies.   Review of Systems Review of Systems  Constitutional: Negative for chills, diaphoresis and fever.  HENT: Positive for nosebleeds. Negative for congestion and rhinorrhea.   Eyes: Negative for visual disturbance.  Respiratory: Negative for cough, chest tightness, shortness of breath, wheezing and stridor.   Cardiovascular: Negative for chest pain and palpitations.  Gastrointestinal: Negative for abdominal pain, diarrhea and nausea.  Genitourinary: Negative for dysuria.  Musculoskeletal: Negative for back pain.  Neurological: Negative for speech difficulty, weakness, light-headedness, numbness and headaches.  Psychiatric/Behavioral: Negative for agitation, behavioral problems and confusion.  All other systems reviewed and are negative.    Physical Exam Updated Vital Signs BP 154/98 (BP Location: Left Arm)   Pulse 74   Temp 97.8 F (36.6 C) (Oral)   Resp 18   SpO2 97%   Physical Exam  Constitutional: He is oriented to person, place, and time. He appears well-developed and well-nourished. No distress.  HENT:  Head: Atraumatic.  Right Ear: External ear normal.  Left Ear: External ear normal.  Nose: Epistaxis is observed.    Mouth/Throat:    Clotted blood appreciated in the left nare. Dry blood on face. Slow bleeding running down posterior oropharynx.  Eyes: Conjunctivae and EOM are normal. Pupils are equal, round, and reactive to light.  Neck: Normal range of motion. Neck supple.  Cardiovascular: Normal rate, normal heart sounds and intact distal pulses.   No murmur heard. Pulmonary/Chest: Effort normal and breath sounds normal. No stridor. No respiratory distress. He has no wheezes. He exhibits no tenderness.  Abdominal: Soft. There is no tenderness.  Musculoskeletal: He exhibits no edema.  Neurological: He is alert and oriented to person, place, and time. He  exhibits normal muscle tone. Coordination normal.  Skin: Skin is warm and dry. Capillary refill takes less than 2 seconds. He is not diaphoretic.  Psychiatric: He has a normal mood and affect.  Nursing note and vitals reviewed.    ED Treatments / Results  Labs (all labs ordered are listed, but only abnormal results are displayed) Labs Reviewed  PROTIME-INR - Abnormal; Notable for the following:       Result Value   Prothrombin Time 17.3 (*)    All other components within normal limits  BASIC METABOLIC PANEL - Abnormal; Notable for the following:    BUN 34 (*)    All other components within normal limits  CBC    EKG  EKG Interpretation None       Radiology No results found.  Procedures Procedures (including critical care time)  Medications Ordered in ED Medications  oxymetazoline (AFRIN) 0.05 % nasal spray 2 spray (2 sprays Each Nare Given 12/20/15 0934)  silver nitrate applicators applicator 1 Stick (1  Stick Topical Given by Other 12/20/15 0953)  silver nitrate applicators applicator 1 Stick (1 Stick Topical Given by Other 12/20/15 1732)  bacitracin 500 UNIT/GM ointment (1 application  Given by Other 12/20/15 1732)     Initial Impression / Assessment and Plan / ED Course  I have reviewed the triage vital signs and the nursing notes.  Pertinent labs & imaging results that were available during my care of the patient were reviewed by me and considered in my medical decision making (see chart for details).  Clinical Course   JENNA ARDOIN is a 82 y.o. male with a past medical history significant for dementia, COPD, CK D, CHF, and history of A. fib and PE on Xarelto who presents with epistaxis. On initial exam, patient had clotted blood in nose. Patient provided Afrin sprays. 4. Time, patient had no recurrent epistaxis however, after ambulation to the bathroom, epistaxis resumed.  An attempt was made to use Afrin, suction, nose blowing, and a nasal packing however, patient  did not tolerate this. Patient pulled out the packing causing more bleeding.  Given the patient's amount of bleeding and Xarelto use, screening laboratory testing ordered to look for anemia.  Patient had results as above. Hemoglobin was normal at 14.3 and no evidence of leukocytosis. Electrolytes were grossly unremarkable. INR was 1.4.  Given the failure to place packing at the bedside, otolaryngology was called in consultation.   Otolaryngology came to the bedside and placed a posterior nasal packing. Initially, the plan was to discharge the patient with antibiotics to prevent toxic shock and follow-up with ENT clinic in 5 days for packing removal.   However, Patient was observed for a period of time after and bled through the packing. Otolaryngology reassessed the patient and reported more brisk bleeding.  ENT will take patient to OR for further management. Patient will be reassessed by ENT after OR to determine disposition. PT taken to OR without further complication.      Final Clinical Impressions(s) / ED Diagnoses   Final diagnoses:  Acute posterior epistaxis    New Prescriptions New Prescriptions   CEPHALEXIN (KEFLEX) 500 MG CAPSULE    Take 1 capsule (500 mg total) by mouth 2 (two) times daily.    Clinical Impression: 1. Acute posterior epistaxis     Disposition: to OR with ENT  Condition: Good  I have discussed the results, Dx and Tx plan with the pt(& family if present). He/she/they expressed understanding and agree(s) with the plan. Discharge instructions discussed at great length. Strict return precautions discussed and pt &/or family have verbalized understanding of the instructions. No further questions at time of discharge.    New Prescriptions   CEPHALEXIN (KEFLEX) 500 MG CAPSULE    Take 1 capsule (500 mg total) by mouth 2 (two) times daily.    Follow Up: Drema Halon, MD [address] [place] [address] [phone]  In 5 days If  symptoms worsen, [please return to the nearest ED.     Canary Brim Tegeler, MD 12/20/15 [phone]

## 2015-12-20 NOTE — Transfer of Care (Signed)
Immediate Anesthesia Transfer of Care Note  Patient: Craig Bennett  Procedure(s) Performed: Procedure(s): NASAL ENDOSCOPY EPISTAXIS CONTROL (N/A)  Patient Location: PACU  Anesthesia Type:General  Level of Consciousness: awake, alert , oriented and patient cooperative  Airway & Oxygen Therapy: Patient Spontanous Breathing and Patient connected to face mask oxygen  Post-op Assessment: Report given to RN, Post -op Vital signs reviewed and stable and Patient moving all extremities X 4  Post vital signs: stable  Last Vitals:  Vitals:   12/20/15 1600 12/20/15 2020  BP: 172/93 (!) 190/121  Pulse:  79  Resp:  13  Temp:  36.5 C    Last Pain:  Vitals:   12/20/15 0856  TempSrc: Oral         Complications: No apparent anesthesia complications

## 2015-12-20 NOTE — Anesthesia Postprocedure Evaluation (Signed)
Anesthesia Post Note  Patient: Craig Bennett  Procedure(s) Performed: Procedure(s) (LRB): NASAL ENDOSCOPY EPISTAXIS CONTROL (N/A)  Patient location during evaluation: PACU Anesthesia Type: General Level of consciousness: awake and alert and patient cooperative Pain management: pain level controlled Vital Signs Assessment: post-procedure vital signs reviewed and stable Respiratory status: spontaneous breathing, nonlabored ventilation and respiratory function stable Cardiovascular status: blood pressure returned to baseline and stable Postop Assessment: no signs of nausea or vomiting Anesthetic complications: no    Last Vitals:  Vitals:   12/20/15 2200 12/20/15 2219  BP: (!) 142/115   Pulse: 77   Resp: 16 19  Temp:      Last Pain:  Vitals:   12/20/15 0856  TempSrc: Oral                 Vickey Boak,E. Leshea Jaggers

## 2015-12-20 NOTE — ED Notes (Signed)
Bed: WA14 Expected date:  Expected time:  Means of arrival:  Comments: EMS 

## 2015-12-20 NOTE — Consult Note (Signed)
Reason for Consult:left sided epistaxis Referring Physician: [facility]  Craig Bennett is an 82 y.o. male.  HPI: started with a nosebleed yesterday that stopped with pressure. Began bleeding again this pm and presented to [facility] ER. Patient didn't tolerate nasal packing and I was consulted to assist in controlling the bleeding.  Past Medical History:  Diagnosis Date  . A-fib (HCC)    a. CHA2DS2VASc = 2, prev on coumadin but d/c'd ~ 07/2014 - told he was in sinus rhythm;  b. 09/2014 amio d/c'd in setting of persistent afib and bradycardia  . Chronic diastolic CHF (congestive heart failure) (HCC)   . CKD (chronic kidney disease), stage II   . COPD (chronic obstructive pulmonary disease) (HCC)   . Dementia    a. 09/2014 Aricept d/c'd in setting of bradycardia.  . History of echocardiogram    a. 11/2008 Echo: EF 60-65%, no rwma, mild LVH, triv AI, mild MR, mildly dil LA/RA.  . Low testosterone   . PE (pulmonary embolism)   . Renal insufficiency   . Syncope    a. 09/2014    History reviewed. No pertinent surgical history.  Social History:  reports that he has never smoked. He has never used smokeless tobacco. He reports that he drinks alcohol. He reports that he does not use drugs.  Allergies: No Known Allergies  Medications: I have reviewed the patient's current medications.  Results for orders placed or performed during the hospital encounter of 12/20/15 (from the past 48 hour(s))  Protime-INR     Status: Abnormal   Collection Time: 12/20/15  3:00 PM  Result Value Ref Range   Prothrombin Time 17.3 (H) 11.4 - 15.2 seconds   INR 1.40   CBC     Status: None   Collection Time: 12/20/15  3:00 PM  Result Value Ref Range   WBC 5.6 4.0 - 10.5 K/uL   RBC 4.55 4.22 - 5.81 MIL/uL   Hemoglobin 14.3 13.0 - 17.0 g/dL   HCT 43.9 39.0 - 52.0 %   MCV 96.5 78.0 - 100.0 fL   MCH 31.4 26.0 - 34.0 pg   MCHC 32.6 30.0 - 36.0 g/dL   RDW 13.8 11.5 - 15.5 %   Platelets 225 150 - 400 K/uL  Basic metabolic  panel     Status: Abnormal   Collection Time: 12/20/15  3:00 PM  Result Value Ref Range   Sodium 139 135 - 145 mmol/L   Potassium 4.5 3.5 - 5.1 mmol/L   Chloride 106 101 - 111 mmol/L   CO2 28 22 - 32 mmol/L   Glucose, Bld 95 65 - 99 mg/dL   BUN 34 (H) 6 - 20 mg/dL   Creatinine, Ser 1.02 0.61 - 1.24 mg/dL   Calcium 9.5 8.9 - 10.3 mg/dL   GFR calc non Af Amer >60 >60 mL/min   GFR calc Af Amer >60 >60 mL/min    Comment: (NOTE) The eGFR has been calculated using the CKD EPI equation. This calculation has not been validated in all clinical situations. eGFR's persistently <60 mL/min signify possible Chronic Kidney Disease.    Anion gap 5 5 - 15    No results found.  ROS:No previous history of nose bleeds. But on xarelto   PE:Bleeding from the left nostril Patient not cooperative on exam but on exam not coming from anterior septum. Probably mid or posterior Nose packed with 7 cm rhino rocket and cotton ball  Assessment/Plan: Left sided epistaxis  Nose packed Place   on antibiotics and have patient follow up in my office in 5 days to have the packing removed.  Craig Bennett 12/20/2015, 5:27 PM     

## 2015-12-20 NOTE — ED Notes (Signed)
Patient is not willing to sign consent for nasal endoscopy-SX to page Dr Ezzard StandingNewman when patient agrees to procedure per dr Ezzard StandingNewman

## 2015-12-20 NOTE — Anesthesia Preprocedure Evaluation (Addendum)
Anesthesia Evaluation  Patient identified by MRN, date of birth, ID band Patient awake and Patient confused    Reviewed: Allergy & Precautions, NPO status , Patient's Chart, lab work & pertinent test results, reviewed documented beta blocker date and time Preop documentation limited or incomplete due to emergent nature of procedure.  History of Anesthesia Complications Negative for: history of anesthetic complications  Airway Mallampati: II  TM Distance: >3 FB Neck ROM: Full    Dental  (+) Chipped, Dental Advisory Given   Pulmonary PE   breath sounds clear to auscultation       Cardiovascular hypertension, Pt. on medications and Pt. on home beta blockers (-) angina+ dysrhythmias Atrial Fibrillation  Rhythm:Regular Rate:Normal  5/17 ECHO: EF 55%, mod TR, pulm HTN   Neuro/Psych PSYCHIATRIC DISORDERS (dementia) negative neurological ROS     GI/Hepatic negative GI ROS, Neg liver ROS,   Endo/Other  negative endocrine ROS  Renal/GU Renal InsufficiencyRenal disease   H/o prostate cancer    Musculoskeletal   Abdominal   Peds  Hematology  (+) Blood dyscrasia (xarelto), ,   Anesthesia Other Findings   Reproductive/Obstetrics                            Anesthesia Physical Anesthesia Plan  ASA: III and emergent  Anesthesia Plan: General   Post-op Pain Management:    Induction: Intravenous, Rapid sequence and Cricoid pressure planned  Airway Management Planned: Oral ETT  Additional Equipment:   Intra-op Plan:   Post-operative Plan: Extubation in OR  Informed Consent: I have reviewed the patients History and Physical, chart, labs and discussed the procedure including the risks, benefits and alternatives for the proposed anesthesia with the patient or authorized representative who has indicated his/her understanding and acceptance.   Dental advisory given and Only emergency history  available  Plan Discussed with: CRNA and Surgeon  Anesthesia Plan Comments: (Plan routine monitors, GETA.  Pt with some dementia, but does understand he has nose bleed that is not clotting.  He agrees to anesthesia.  Brother, Tiburcio PeaHarris, reached by telephone, also agrees)        Anesthesia Quick Evaluation

## 2015-12-20 NOTE — ED Triage Notes (Signed)
Per [facility] , self induced , unintentional , nose bleed at 4 Am . Bleeding was controled initially then started bleeding again per staff per [facility]. Pt on Xareleto  Alert and oriented x 4.

## 2015-12-20 NOTE — Anesthesia Procedure Notes (Signed)
Procedure Name: Intubation Date/Time: 12/20/2015 6:41 PM Performed by: Illene SilverEVANS, Jullian Clayson E Pre-anesthesia Checklist: Patient identified, Emergency Drugs available, Suction available and Patient being monitored Patient Re-evaluated:Patient Re-evaluated prior to inductionOxygen Delivery Method: Circle system utilized Preoxygenation: Pre-oxygenation with 100% oxygen Intubation Type: IV induction, Rapid sequence and Cricoid Pressure applied Laryngoscope Size: Mac and 4 Grade View: Grade II Tube type: Oral (large epiglottis see posterior arytenoids) Tube size: 7.5 mm Number of attempts: 1 Airway Equipment and Method: Stylet and Oral airway Placement Confirmation: ETT inserted through vocal cords under direct vision,  positive ETCO2 and breath sounds checked- equal and bilateral Secured at: 21 cm Tube secured with: Tape Dental Injury: Teeth and Oropharynx as per pre-operative assessment

## 2015-12-20 NOTE — Discharge Instructions (Signed)
Stop taking Xarelto for two days. Restart Xarelto on Sunday. Don't blow your nose hard for 5 days OK to blow gently Call Dr Ezzard StandingNewman if you have any bleeding.

## 2015-12-20 NOTE — Brief Op Note (Signed)
12/20/2015  8:06 PM  PATIENT:  Craig Bennett  82 y.o. male  PRE-OPERATIVE DIAGNOSIS:  uncontrolled nose bleed  POST-OPERATIVE DIAGNOSIS:  uncontrolled nose bleed  PROCEDURE:  Procedure(s): NASAL ENDOSCOPY EPISTAXIS CONTROL (N/A) left  SURGEON:  Surgeon(s) and Role:    * Drema Halonhristopher E Sarahi Borland, MD - Primary  PHYSICIAN ASSISTANT:   ASSISTANTS: none   ANESTHESIA:   general  EBL:  Total I/O In: -  Out: 25 [Blood:25]  BLOOD ADMINISTERED:none  DRAINS: none   LOCAL MEDICATIONS USED:  NONE  SPECIMEN:  No Specimen  DISPOSITION OF SPECIMEN:  N/A  COUNTS:  YES  TOURNIQUET:  * No tourniquets in log *  DICTATION: .Other Dictation: Dictation Number [id number]  PLAN OF CARE: Discharge to home after PACU  PATIENT DISPOSITION:  PACU - hemodynamically stable.   Delay start of Pharmacological VTE agent (>24hrs) due to surgical blood loss or risk of bleeding: yes

## 2015-12-21 NOTE — Op Note (Signed)
NAME:  Craig Bennett, Craig Bennett                 ACCOUNT NO.:  [id number]  MEDICAL RECORD NO.:  [id number]  LOCATION:  [facility]                         [facility]  PHYSICIAN:  Kristine GarbeChristopher E. Ezzard StandingNewman, M.D.DATE OF BIRTH:  12/26/1929  DATE OF PROCEDURE:  12/20/2015 DATE OF DISCHARGE:  12/20/2015                              OPERATIVE REPORT   PREOPERATIVE DIAGNOSIS:  Chronic left-sided epistaxis, not controlled with packing.  POSTOPERATIVE DIAGNOSIS:  Chronic left-sided epistaxis, not controlled with packing.  OPERATION PERFORMED:  Endoscopic cauterization of left-sided epistaxis.  SURGEON:  Kristine GarbeChristopher E. Ezzard StandingNewman, M.D.  ANESTHESIA:  General endotracheal.  COMPLICATIONS:  None.  BRIEF CLINICAL NOTE:  Vickki HearingJames Fletes is an 82 year old gentleman who [facility] because of persistent left-sided nosebleed.  He was really not that cooperative and ER physicians could not control his nosebleed and I was asked to evaluate the patient.  The patient had fairly brisk bleeding from the left side of his nose, appeared to be arising more inferiorly, but could not definitely determine the site or origin of the nosebleed.  The nose was initially packed with rhino rocket, cotton ball and Afrin, but he continued to have bleeding from left side of his nose and he was taken to the operating room at this time for endoscopic cauterization of left-sided epistaxis.  Of note, the patient has been on Xarelto.  DESCRIPTION OF PROCEDURE:  After adequate endotracheal anesthesia, the packing from the left side of his nose was removed.  The area of bleeding seen being arise from the lateral undersurface of the inferior turbinate especially anteriorly.  There was bright red bleeding exuding from the anterior left inferior turbinate from its lateral surface underneath the inferior meatus.  No other site or origin of bleeding was seen.  First, silver nitrate was applied to this area and then  using the hand-held suction cautery, the undersurface of the left inferior turbinate was cauterized in the close margin to the drainage of the nasolacrimal duct.  After controlling the bleeding, the remaining nose was examined with endoscope and no other site or bleeding was seen. There were some septal spurs on the left side, but did not identify any site or origin along the septum.  Following silver nitrate cauterization and suction cautery cauterization of the left inferior turbinate, the inferior meatus was packed with Surgicel.  Bacitracin ointment was applied.  The upper nasal cavity was not packed, it was left open.  The right side was examined with the endoscope and the right side was clear.  This completed the procedure. [name] was awakened from anesthesia and transferred to the recovery room, postop doing well.  He was discharged back to his living facility.  He was instructed to stop the Xarelto for 1 day and restart Xarelto in 2 days.          ______________________________ Kristine Garbehristopher E. Ezzard StandingNewman, M.D.     CEN/MEDQ  D:  12/20/2015  T:  12/21/2015  Job:  [id number]

## 2015-12-24 ENCOUNTER — Encounter (HOSPITAL_COMMUNITY): Payer: Self-pay | Admitting: Otolaryngology

## 2015-12-25 ENCOUNTER — Encounter (HOSPITAL_COMMUNITY): Payer: Self-pay | Admitting: Emergency Medicine

## 2015-12-25 ENCOUNTER — Emergency Department (HOSPITAL_COMMUNITY)
Admission: EM | Admit: 2015-12-25 | Discharge: 2015-12-25 | Disposition: A | Discharge status: Home / Self Care | Payer: Medicare PPO | Attending: Emergency Medicine | Admitting: Emergency Medicine

## 2015-12-25 DIAGNOSIS — N183 Chronic kidney disease, stage 3 (moderate): Secondary | ICD-10-CM | POA: Insufficient documentation

## 2015-12-25 DIAGNOSIS — I130 Hypertensive heart and chronic kidney disease with heart failure and stage 1 through stage 4 chronic kidney disease, or unspecified chronic kidney disease: Secondary | ICD-10-CM | POA: Insufficient documentation

## 2015-12-25 DIAGNOSIS — Z8546 Personal history of malignant neoplasm of prostate: Secondary | ICD-10-CM | POA: Diagnosis not present

## 2015-12-25 DIAGNOSIS — Z7901 Long term (current) use of anticoagulants: Secondary | ICD-10-CM | POA: Diagnosis not present

## 2015-12-25 DIAGNOSIS — Z7982 Long term (current) use of aspirin: Secondary | ICD-10-CM | POA: Diagnosis not present

## 2015-12-25 DIAGNOSIS — R040 Epistaxis: Secondary | ICD-10-CM | POA: Insufficient documentation

## 2015-12-25 DIAGNOSIS — J449 Chronic obstructive pulmonary disease, unspecified: Secondary | ICD-10-CM | POA: Diagnosis not present

## 2015-12-25 DIAGNOSIS — I5032 Chronic diastolic (congestive) heart failure: Secondary | ICD-10-CM | POA: Diagnosis not present

## 2015-12-25 LAB — CBC WITH DIFFERENTIAL/PLATELET
Basophils Absolute: 0 10*3/uL (ref 0.0–0.1)
Basophils Relative: 0 %
Eosinophils Absolute: 0.1 10*3/uL (ref 0.0–0.7)
Eosinophils Relative: 1 %
HCT: 41.1 % (ref 39.0–52.0)
HEMOGLOBIN: 13.2 g/dL (ref 13.0–17.0)
LYMPHS ABS: 1.1 10*3/uL (ref 0.7–4.0)
LYMPHS PCT: 13 %
MCH: 31.7 pg (ref 26.0–34.0)
MCHC: 32.1 g/dL (ref 30.0–36.0)
MCV: 98.6 fL (ref 78.0–100.0)
Monocytes Absolute: 0.5 10*3/uL (ref 0.1–1.0)
Monocytes Relative: 6 %
NEUTROS PCT: 80 %
Neutro Abs: 6.9 10*3/uL (ref 1.7–7.7)
Platelets: 256 10*3/uL (ref 150–400)
RBC: 4.17 MIL/uL — AB (ref 4.22–5.81)
RDW: 13.6 % (ref 11.5–15.5)
WBC: 8.7 10*3/uL (ref 4.0–10.5)

## 2015-12-25 LAB — BASIC METABOLIC PANEL
Anion gap: 5 (ref 5–15)
BUN: 30 mg/dL — AB (ref 6–20)
CHLORIDE: 107 mmol/L (ref 101–111)
CO2: 28 mmol/L (ref 22–32)
Calcium: 9.7 mg/dL (ref 8.9–10.3)
Creatinine, Ser: 0.93 mg/dL (ref 0.61–1.24)
GFR calc Af Amer: >60 mL/min (ref >60)
GFR calc non Af Amer: >60 mL/min (ref >60)
GLUCOSE: 109 mg/dL — AB (ref 65–99)
POTASSIUM: 4.1 mmol/L (ref 3.5–5.1)
SODIUM: 140 mmol/L (ref 135–145)

## 2015-12-25 LAB — PROTIME-INR
INR: 1.55
PROTHROMBIN TIME: 18.8 s — AB (ref 11.4–15.2)

## 2015-12-25 LAB — POC OCCULT BLOOD, ED: Fecal Occult Bld: NEGATIVE

## 2015-12-25 NOTE — ED Triage Notes (Signed)
Pt arrives via [facility] from [facility] for c/o epistaxis since 0800, bleeding minimal at this time. Ems reports the facility stated patient was seen here 2-3 days ago for the same and had cauterization to stop bleeding. Ems reports pt is on blood thinners but facility has held those the past 2 days. Pt reported black stools this morning.

## 2015-12-25 NOTE — ED Notes (Signed)
Contacted nursing staff at [facility], discussed d/c instructions and follow-up care. No further questions/concerns.

## 2015-12-25 NOTE — ED Notes (Signed)
Pt notified by pharmacist that pt was standing at door. This RN went over to door, explained pt needed to return to stretcher. Pt lost balance, this RN grabbed pt under shoulders and assisted pt to floor. No injuries or complaints noted at this time. No LOC. Dr. Ethelda ChickJacubowitz and Shanda BumpsJessica, Charge RN aware. Bed alarm in place.

## 2015-12-25 NOTE — ED Notes (Signed)
[facility] arrived to transport pt to facility  

## 2015-12-25 NOTE — ED Provider Notes (Signed)
4:10 PM I was called to bedside as patient had fallen. He was apparently eased to the floor by the nurse. Struck his head gently  period. Patient is alert denies pain anywhere. HEENT exam no signs of trauma neck nontender. Neurologic moves all extremities. Cranial nerves II through XII grossly intact. Alert and awake    Craig SouSam Kynadie Yaun, MD 12/26/15 0025

## 2015-12-25 NOTE — ED Notes (Signed)
Bed alarm up on pts bed.

## 2015-12-25 NOTE — ED Provider Notes (Signed)
[facility]-EMERGENCY DEPT Provider Note   CSN: [id number] Arrival date & time: 12/25/15  1015     History   Chief Complaint Chief Complaint  Patient presents with  . Epistaxis    HPI Craig Bennett is a 82 y.o. male.   Epistaxis   This is a recurrent problem. The current episode started more than 2 days ago. The problem occurs hourly. The problem has not changed since onset.The problem is associated with anticoagulants. The bleeding has been from both (left greater than right) nares. He has tried applying pressure and ice for the symptoms. His past medical history is significant for frequent nosebleeds. Past medical history comments: prior operative cautery.    Past Medical History:  Diagnosis Date  . A-fib (HCC)    a. CHA2DS2VASc = 2, prev on coumadin but d/c'd ~ 07/2014 - told he was in sinus rhythm;  b. 09/2014 amio d/c'd in setting of persistent afib and bradycardia  . Chronic diastolic CHF (congestive heart failure) (HCC)   . CKD (chronic kidney disease), stage II   . COPD (chronic obstructive pulmonary disease) (HCC)   . Dementia    a. 09/2014 Aricept d/c'd in setting of bradycardia.  Marland Kitchen. History of echocardiogram    a. 11/2008 Echo: EF 60-65%, no rwma, mild LVH, triv AI, mild MR, mildly dil LA/RA.  Marland Kitchen. Low testosterone   . PE (pulmonary embolism)   . Renal insufficiency   . Syncope    a. 09/2014    Patient Active Problem List   Diagnosis Date Noted  . Pulmonary embolus (HCC) 08/06/2015  . AKI (acute kidney injury) (HCC) 10/24/2014  . Atrial fibrillation, unspecified   . Syncope and collapse   . Syncope 10/22/2014  . Dementia 10/22/2014  . Chronic atrial fibrillation (HCC) 10/22/2014  . Orthostatic hypotension 10/22/2014  . Symptomatic bradycardia 10/22/2014  . Low testosterone 10/22/2014  . CKD (chronic kidney disease), stage III 10/22/2014  . H/O prostate cancer 10/22/2014    Past Surgical History:  Procedure Laterality Date  . NASAL HEMORRHAGE CONTROL N/A 12/20/2015    Procedure: NASAL ENDOSCOPY EPISTAXIS CONTROL;  Surgeon: Drema Halonhristopher E Newman, MD;  Location: [facility];  Service: ENT;  Laterality: N/A;       Home Medications    Prior to Admission medications   Medication Sig Start Date End Date Taking? Authorizing Provider  acetaminophen (TYLENOL) 325 MG tablet Take 2 tablets (650 mg total) by mouth every 6 (six) hours as needed for mild pain (or Fever >/= 101). Patient taking differently: Take 650 mg by mouth every 6 (six) hours as needed for mild pain.  08/10/15   Alison MurrayAlma M Devine, MD  aspirin 81 MG tablet Take 81 mg by mouth at bedtime.     Historical Provider, MD  atenolol (TENORMIN) 25 MG tablet Take 0.5 tablets (12.5 mg total) by mouth daily. 08/10/15   Alison MurrayAlma M Devine, MD  cephALEXin (KEFLEX) 500 MG capsule Take 1 capsule (500 mg total) by mouth 2 (two) times daily. 12/20/15 12/27/15  Canary Brimhristopher J Tegeler, MD  doxycycline (VIBRA-TABS) 100 MG tablet Take 1 tablet (100 mg total) by mouth 2 (two) times daily. Patient not taking: Reported on 12/20/2015 08/10/15   Alison MurrayAlma M Devine, MD  memantine (NAMENDA) 5 MG tablet Take 1 tablet (5 mg total) by mouth daily. 10/25/14   Marinda ElkAbraham Feliz Ortiz, MD  PRESCRIPTION MEDICATION Take 120 mLs by mouth 2 (two) times daily. Med Pass 2.0    Historical Provider, MD  rivaroxaban (XARELTO) 20 MG  TABS tablet Take 1 tablet (20 mg total) by mouth daily with supper. 08/31/15   Alison Murray, MD  therapeutic multivitamin-minerals [facility]) tablet Take 1 tablet by mouth at bedtime.    Historical Provider, MD    Family History Family History  Problem Relation Age of Onset  . Other      no premature CAD.    Social History Social History  Substance Use Topics  . Smoking status: Never Smoker  . Smokeless tobacco: Never Used  . Alcohol use Yes     Comment: "very little, occasional wine with food."     Allergies   Review of patient's allergies indicates no known allergies.   Review of Systems Review of Systems  Unable to  perform ROS: Dementia  HENT: Positive for nosebleeds.   Gastrointestinal: Positive for blood in stool (concern for dark stools).  Neurological: Negative for dizziness, syncope and light-headedness.     Physical Exam Updated Vital Signs BP 130/74 (BP Location: Right Arm)   Pulse 91   Temp 97.7 F (36.5 C) (Oral)   Resp 14   SpO2 100%   Physical Exam  Constitutional: He appears well-developed and well-nourished.  HENT:  Head: Normocephalic and atraumatic.  Oozing bleeding from both nares, left > right.  Eyes: Conjunctivae and EOM are normal. Pupils are equal, round, and reactive to light.  Neck: Neck supple.  Cardiovascular: Normal rate and regular rhythm.   No murmur heard. Pulmonary/Chest: Effort normal and breath sounds normal. No respiratory distress.  Abdominal: Soft. There is no tenderness.  Musculoskeletal: He exhibits no edema.  Neurological: He is alert.  Skin: Skin is warm and dry.  Psychiatric: He has a normal mood and affect.  Nursing note and vitals reviewed.    ED Treatments / Results  Labs (all labs ordered are listed, but only abnormal results are displayed) Labs Reviewed  CBC WITH DIFFERENTIAL/PLATELET  BASIC METABOLIC PANEL  PROTIME-INR  POC OCCULT BLOOD, ED    EKG  EKG Interpretation None       Radiology No results found.  Procedures Procedures (including critical care time)  Medications Ordered in ED Medications - No data to display   Initial Impression / Assessment and Plan / ED Course  I have reviewed the triage vital signs and the nursing notes.  Pertinent labs & imaging results that were available during my care of the patient were reviewed by me and considered in my medical decision making (see chart for details).  Clinical Course    Craig Bennett is an 82 year old male with past medical history significant for advanced dementia, a-fib on xeralto, recent epistaxis with surgical repair, COPD, CKD, CHF who presents for recurrence  of epistaxis.  He states he has had on-and-off bleeding since the surgical intervention.  Pressure was applied and the bleeding slowed but did not resolve.  Labs ordered, including CBC, BMP, and PT/INR.  Results significant for stable hemoglobin, slightly increase BUN.  Rectal exam performed due to questionable history of dark stools.  Negative.  Patient's nursing facility contacted but was unable to confirm whether the patient has had ongoing bleeding or if today was the first bleed since ENT intervention.  They also believe the patient has continued on xeralto, but were unsure based on their records.  ENT consulted and came to the ED to evaluate.  The patient became agitated during the exam and ENT left and will return in about 1/2 hour.  Patient care transitioned to Dr. Mayford Knife at about  1600.  Plan to to complete evaluation from ENT.  Final Clinical Impressions(s) / ED Diagnoses   Final diagnoses:  None    New Prescriptions New Prescriptions   No medications on file     Garey Ham, MD 12/26/15 1610    Vanetta Mulders, MD 12/29/15 1630

## 2015-12-25 NOTE — ED Notes (Signed)
Aware pt removed nasal packing from nose and ok for pt to return to facility per Dr. Ethelda ChickJacubowitz

## 2015-12-25 NOTE — ED Notes (Signed)
Pt removed packing from nose. Dr. Mayford KnifeWilliams, Resident, aware and at bedside.

## 2015-12-25 NOTE — ED Notes (Signed)
Ice and pressure applied to patient's nose to stop bleeding.

## 2015-12-25 NOTE — Consult Note (Signed)
Reason for Consult:left sided epistaxis Referring Physician: [place]  Craig Bennett is an 82 y.o. male.  HPI: Patient started with nosebleeds last week and was seen in [facility] ER 5 days ago with nose bleed that wouldn't stop with rhino rocket. Patient is not cooperative to exam or evaluation at bedside and was taken to the OR last Thursday night for cauterization. Bleeding site appeared to be arising from the lateral potion of the inferior turbinate. Patient did well for several days but has continued to have some bleeding and presents to [facility] ER. Hgb had dropped from 14 to 13. Patient has been on Xarelto for chronic a fib.  Past Medical History:  Diagnosis Date  . A-fib ([place])    a. CHA2DS2VASc = 2, prev on coumadin but d/c'd ~ 07/2014 - told he was in sinus rhythm;  b. 09/2014 amio d/c'd in setting of persistent afib and bradycardia  . Chronic diastolic CHF (congestive heart failure) ([place])   . CKD (chronic kidney disease), stage II   . COPD (chronic obstructive pulmonary disease) ([place])   . Dementia    a. 09/2014 Aricept d/c'd in setting of bradycardia.  Marland Kitchen History of echocardiogram    a. 11/2008 Echo: EF 60-65%, no rwma, mild LVH, triv AI, mild MR, mildly dil LA/RA.  Marland Kitchen Low testosterone   . PE (pulmonary embolism)   . Renal insufficiency   . Syncope    a. 09/2014    Past Surgical History:  Procedure Laterality Date  . NASAL HEMORRHAGE CONTROL N/A 12/20/2015   Procedure: NASAL ENDOSCOPY EPISTAXIS CONTROL;  Surgeon: Rozetta Nunnery, MD;  Location: [facility];  Service: ENT;  Laterality: N/A;    Social History:  reports that he has never smoked. He has never used smokeless tobacco. He reports that he drinks alcohol. He reports that he does not use drugs.  Allergies: No Known Allergies  Medications: I have reviewed the patient's current medications.  Results for orders placed or performed during the hospital encounter of 12/25/15 (from the past 48 hour(s))  POC occult blood, ED Provider will  collect     Status: None   Collection Time: 12/25/15 10:54 AM  Result Value Ref Range   Fecal Occult Bld NEGATIVE NEGATIVE  CBC with Differential     Status: Abnormal   Collection Time: 12/25/15 11:30 AM  Result Value Ref Range   WBC 8.7 4.0 - 10.5 K/uL   RBC 4.17 (L) 4.22 - 5.81 MIL/uL   Hemoglobin 13.2 13.0 - 17.0 g/dL   HCT 41.1 39.0 - 52.0 %   MCV 98.6 78.0 - 100.0 fL   MCH 31.7 26.0 - 34.0 pg   MCHC 32.1 30.0 - 36.0 g/dL   RDW 13.6 11.5 - 15.5 %   Platelets 256 150 - 400 K/uL   Neutrophils Relative % 80 %   Neutro Abs 6.9 1.7 - 7.7 K/uL   Lymphocytes Relative 13 %   Lymphs Abs 1.1 0.7 - 4.0 K/uL   Monocytes Relative 6 %   Monocytes Absolute 0.5 0.1 - 1.0 K/uL   Eosinophils Relative 1 %   Eosinophils Absolute 0.1 0.0 - 0.7 K/uL   Basophils Relative 0 %   Basophils Absolute 0.0 0.0 - 0.1 K/uL  Basic metabolic panel     Status: Abnormal   Collection Time: 12/25/15 11:30 AM  Result Value Ref Range   Sodium 140 135 - 145 mmol/L   Potassium 4.1 3.5 - 5.1 mmol/L   Chloride 107 101 -  111 mmol/L   CO2 28 22 - 32 mmol/L   Glucose, Bld 109 (H) 65 - 99 mg/dL   BUN 30 (H) 6 - 20 mg/dL   Creatinine, Ser 0.93 0.61 - 1.24 mg/dL   Calcium 9.7 8.9 - 10.3 mg/dL   GFR calc non Af Amer >60 >60 mL/min   GFR calc Af Amer >60 >60 mL/min    Comment: (NOTE) The eGFR has been calculated using the CKD EPI equation. This calculation has not been validated in all clinical situations. eGFR's persistently <60 mL/min signify possible Chronic Kidney Disease.    Anion gap 5 5 - 15  Protime-INR     Status: Abnormal   Collection Time: 12/25/15 11:30 AM  Result Value Ref Range   Prothrombin Time 18.8 (H) 11.4 - 15.2 seconds   INR 1.55     No results found.  YBO:FBPZWCH can't respond appropriately and is uncooperative to exam   EN:IDPOE amount of bleeding from the left nostril. Could not adequately identify site or origin of bleeding but was able to stop the bleeding with anteriorly placed  cotton ball with neosenephrine.  Assessment/Plan: Leave cotton ball inn place for 3 days Antibiotics while cotton ball in place If he has further bleeding will need to go to the OR.  [name] 12/25/2015, 4:58 PM

## 2015-12-25 NOTE — ED Notes (Signed)
Pt attempting to climb out of stretcher, states "he might as well leave since they aren't going to do anything with me." This RN explained waiting for ENT consult. Pt verbalized understanding, assisted back into stretcher. No further questions/concerns at this time.

## 2015-12-25 NOTE — ED Provider Notes (Signed)
I saw and evaluated the patient, reviewed the resident's note and I agree with the findings and plan.   EKG Interpretation None        Results for orders placed or performed during the hospital encounter of 12/25/15  CBC with Differential  Result Value Ref Range   WBC 8.7 4.0 - 10.5 K/uL   RBC 4.17 (L) 4.22 - 5.81 MIL/uL   Hemoglobin 13.2 13.0 - 17.0 g/dL   HCT 91.441.1 78.239.0 - 95.652.0 %   MCV 98.6 78.0 - 100.0 fL   MCH 31.7 26.0 - 34.0 pg   MCHC 32.1 30.0 - 36.0 g/dL   RDW 21.313.6 08.611.5 - 57.815.5 %   Platelets 256 150 - 400 K/uL   Neutrophils Relative % 80 %   Neutro Abs 6.9 1.7 - 7.7 K/uL   Lymphocytes Relative 13 %   Lymphs Abs 1.1 0.7 - 4.0 K/uL   Monocytes Relative 6 %   Monocytes Absolute 0.5 0.1 - 1.0 K/uL   Eosinophils Relative 1 %   Eosinophils Absolute 0.1 0.0 - 0.7 K/uL   Basophils Relative 0 %   Basophils Absolute 0.0 0.0 - 0.1 K/uL  Basic metabolic panel  Result Value Ref Range   Sodium 140 135 - 145 mmol/L   Potassium 4.1 3.5 - 5.1 mmol/L   Chloride 107 101 - 111 mmol/L   CO2 28 22 - 32 mmol/L   Glucose, Bld 109 (H) 65 - 99 mg/dL   BUN 30 (H) 6 - 20 mg/dL   Creatinine, Ser 4.690.93 0.61 - 1.24 mg/dL   Calcium 9.7 8.9 - 62.910.3 mg/dL   GFR calc non Af Amer >60 >60 mL/min   GFR calc Af Amer >60 >60 mL/min   Anion gap 5 5 - 15  Protime-INR  Result Value Ref Range   Prothrombin Time 18.8 (H) 11.4 - 15.2 seconds   INR 1.55   POC occult blood, ED Provider will collect  Result Value Ref Range   Fecal Occult Bld NEGATIVE NEGATIVE   Patient with recurrent nose bleed.  Patient states is been bleeding on and off since his discharge from [facility] where he was seen by ear nose and throat Dr. Ezzard StandingNewman had a scope and had a bleeding controlled. Initially it was thought that he had a posterior nosebleed because they can control it in the emergency department. But file diagnosis was anterior. He had cauterization done by ear nose and throat doctor. Patient without heavy bleeding right  now the bleeding seems to be left greater than right and predominantly on the left side. Air nose and throat has been contacted patient was on Coumadin in the past but that has been stopped. Disposition will be as per ear nose and throat. Patient's labs without significant abnormalities    Vanetta MuldersScott Cathren Sween, MD 12/25/15 1351

## 2015-12-25 NOTE — ED Notes (Signed)
Pt attempting to climb out of stretcher, stating he would like to leave. This RN explained ENT should arrive around 1500. Pt verbalized understanding, assisted back into stretcher by nursing staff.

## 2015-12-25 NOTE — ED Notes (Signed)
MD at bedside. 

## 2016-03-23 ENCOUNTER — Inpatient Hospital Stay (HOSPITAL_COMMUNITY)
Admission: EM | Admit: 2016-03-23 | Discharge: 2016-03-25 | DRG: 871 | Disposition: A | Discharge status: Skilled Nursing Facility | Payer: Medicare PPO | Attending: Internal Medicine | Admitting: Internal Medicine

## 2016-03-23 ENCOUNTER — Encounter (HOSPITAL_COMMUNITY): Payer: Self-pay | Admitting: *Deleted

## 2016-03-23 ENCOUNTER — Emergency Department (HOSPITAL_COMMUNITY): Payer: Medicare PPO

## 2016-03-23 DIAGNOSIS — Y95 Nosocomial condition: Secondary | ICD-10-CM | POA: Diagnosis present

## 2016-03-23 DIAGNOSIS — J189 Pneumonia, unspecified organism: Secondary | ICD-10-CM | POA: Diagnosis present

## 2016-03-23 DIAGNOSIS — Z8546 Personal history of malignant neoplasm of prostate: Secondary | ICD-10-CM | POA: Diagnosis not present

## 2016-03-23 DIAGNOSIS — A419 Sepsis, unspecified organism: Secondary | ICD-10-CM | POA: Diagnosis not present

## 2016-03-23 DIAGNOSIS — G9341 Metabolic encephalopathy: Secondary | ICD-10-CM | POA: Diagnosis present

## 2016-03-23 DIAGNOSIS — Z7901 Long term (current) use of anticoagulants: Secondary | ICD-10-CM

## 2016-03-23 DIAGNOSIS — Z7982 Long term (current) use of aspirin: Secondary | ICD-10-CM

## 2016-03-23 DIAGNOSIS — I482 Chronic atrial fibrillation, unspecified: Secondary | ICD-10-CM | POA: Diagnosis present

## 2016-03-23 DIAGNOSIS — N179 Acute kidney failure, unspecified: Secondary | ICD-10-CM | POA: Diagnosis present

## 2016-03-23 DIAGNOSIS — R4182 Altered mental status, unspecified: Secondary | ICD-10-CM

## 2016-03-23 DIAGNOSIS — J440 Chronic obstructive pulmonary disease with acute lower respiratory infection: Secondary | ICD-10-CM | POA: Diagnosis present

## 2016-03-23 DIAGNOSIS — K5900 Constipation, unspecified: Secondary | ICD-10-CM | POA: Diagnosis present

## 2016-03-23 DIAGNOSIS — N183 Chronic kidney disease, stage 3 unspecified: Secondary | ICD-10-CM | POA: Diagnosis present

## 2016-03-23 DIAGNOSIS — F0390 Unspecified dementia without behavioral disturbance: Secondary | ICD-10-CM | POA: Diagnosis present

## 2016-03-23 DIAGNOSIS — R059 Cough, unspecified: Secondary | ICD-10-CM | POA: Diagnosis present

## 2016-03-23 DIAGNOSIS — R001 Bradycardia, unspecified: Secondary | ICD-10-CM | POA: Diagnosis present

## 2016-03-23 DIAGNOSIS — Z79899 Other long term (current) drug therapy: Secondary | ICD-10-CM | POA: Diagnosis not present

## 2016-03-23 DIAGNOSIS — R05 Cough: Secondary | ICD-10-CM | POA: Diagnosis present

## 2016-03-23 DIAGNOSIS — Z86711 Personal history of pulmonary embolism: Secondary | ICD-10-CM

## 2016-03-23 DIAGNOSIS — I5032 Chronic diastolic (congestive) heart failure: Secondary | ICD-10-CM | POA: Diagnosis present

## 2016-03-23 LAB — COMPREHENSIVE METABOLIC PANEL
ALK PHOS: 68 U/L (ref 38–126)
ALT: 16 U/L — AB (ref 17–63)
ANION GAP: 7 (ref 5–15)
AST: 32 U/L (ref 15–41)
Albumin: 3.4 g/dL — ABNORMAL LOW (ref 3.5–5.0)
BILIRUBIN TOTAL: 0.8 mg/dL (ref 0.3–1.2)
BUN: 15 mg/dL (ref 6–20)
CALCIUM: 9.1 mg/dL (ref 8.9–10.3)
CO2: 23 mmol/L (ref 22–32)
CREATININE: 1.25 mg/dL — AB (ref 0.61–1.24)
Chloride: 104 mmol/L (ref 101–111)
GFR calc non Af Amer: 51 mL/min — ABNORMAL LOW (ref >60)
GFR, EST AFRICAN AMERICAN: 59 mL/min — AB (ref >60)
GLUCOSE: 122 mg/dL — AB (ref 65–99)
Potassium: 4.5 mmol/L (ref 3.5–5.1)
Sodium: 134 mmol/L — ABNORMAL LOW (ref 135–145)
TOTAL PROTEIN: 6.4 g/dL — AB (ref 6.5–8.1)

## 2016-03-23 LAB — CBC WITH DIFFERENTIAL/PLATELET
Basophils Absolute: 0 10*3/uL (ref 0.0–0.1)
Basophils Relative: 0 %
EOS ABS: 0 10*3/uL (ref 0.0–0.7)
EOS PCT: 0 %
HCT: 42.2 % (ref 39.0–52.0)
Hemoglobin: 13.7 g/dL (ref 13.0–17.0)
LYMPHS ABS: 0.7 10*3/uL (ref 0.7–4.0)
LYMPHS PCT: 11 %
MCH: 29.9 pg (ref 26.0–34.0)
MCHC: 32.5 g/dL (ref 30.0–36.0)
MCV: 92.1 fL (ref 78.0–100.0)
MONO ABS: 0.4 10*3/uL (ref 0.1–1.0)
Monocytes Relative: 6 %
Neutro Abs: 5.3 10*3/uL (ref 1.7–7.7)
Neutrophils Relative %: 83 %
PLATELETS: 158 10*3/uL (ref 150–400)
RBC: 4.58 MIL/uL (ref 4.22–5.81)
RDW: 14.4 % (ref 11.5–15.5)
WBC: 6.4 10*3/uL (ref 4.0–10.5)

## 2016-03-23 LAB — I-STAT CG4 LACTIC ACID, ED
LACTIC ACID, VENOUS: 1.74 mmol/L (ref 0.5–1.9)
Lactic Acid, Venous: 2.29 mmol/L (ref 0.5–1.9)

## 2016-03-23 LAB — RESPIRATORY PANEL BY PCR
ADENOVIRUS-RVPPCR: NOT DETECTED
Bordetella pertussis: NOT DETECTED
CORONAVIRUS 229E-RVPPCR: NOT DETECTED
CORONAVIRUS HKU1-RVPPCR: NOT DETECTED
CORONAVIRUS OC43-RVPPCR: NOT DETECTED
Chlamydophila pneumoniae: NOT DETECTED
Coronavirus NL63: NOT DETECTED
Influenza A: NOT DETECTED
Influenza B: NOT DETECTED
METAPNEUMOVIRUS-RVPPCR: NOT DETECTED
MYCOPLASMA PNEUMONIAE-RVPPCR: NOT DETECTED
PARAINFLUENZA VIRUS 1-RVPPCR: NOT DETECTED
PARAINFLUENZA VIRUS 2-RVPPCR: NOT DETECTED
Parainfluenza Virus 3: NOT DETECTED
Parainfluenza Virus 4: NOT DETECTED
Respiratory Syncytial Virus: DETECTED — AB
Rhinovirus / Enterovirus: NOT DETECTED

## 2016-03-23 LAB — PROTIME-INR
INR: 1.6
PROTHROMBIN TIME: 19.2 s — AB (ref 11.4–15.2)

## 2016-03-23 LAB — URINALYSIS, ROUTINE W REFLEX MICROSCOPIC
BACTERIA UA: NONE SEEN
Bilirubin Urine: NEGATIVE
Glucose, UA: NEGATIVE mg/dL
Ketones, ur: 5 mg/dL — AB
Leukocytes, UA: NEGATIVE
NITRITE: NEGATIVE
PROTEIN: NEGATIVE mg/dL
SPECIFIC GRAVITY, URINE: 1.012 (ref 1.005–1.030)
SQUAMOUS EPITHELIAL / LPF: NONE SEEN
pH: 6 (ref 5.0–8.0)

## 2016-03-23 LAB — STREP PNEUMONIAE URINARY ANTIGEN: Strep Pneumo Urinary Antigen: NEGATIVE

## 2016-03-23 LAB — I-STAT ARTERIAL BLOOD GAS, ED
ACID-BASE DEFICIT: 3 mmol/L — AB (ref 0.0–2.0)
BICARBONATE: 21.7 mmol/L (ref 20.0–28.0)
O2 Saturation: 95 %
TCO2: 23 mmol/L (ref 0–100)
pCO2 arterial: 39 mmHg (ref 32.0–48.0)
pH, Arterial: 7.364 (ref 7.350–7.450)
pO2, Arterial: 86 mmHg (ref 83.0–108.0)

## 2016-03-23 LAB — PROCALCITONIN: Procalcitonin: 0.14 ng/mL

## 2016-03-23 LAB — MRSA PCR SCREENING: MRSA BY PCR: NEGATIVE

## 2016-03-23 MED ORDER — SODIUM CHLORIDE 0.9 % IV BOLUS (SEPSIS)
500.0000 mL | Freq: Once | INTRAVENOUS | Status: AC
  Administered 2016-03-23: 500 mL via INTRAVENOUS

## 2016-03-23 MED ORDER — VANCOMYCIN HCL 10 G IV SOLR
1250.0000 mg | INTRAVENOUS | Status: DC
  Administered 2016-03-23: 1250 mg via INTRAVENOUS
  Filled 2016-03-23: qty 1250

## 2016-03-23 MED ORDER — IPRATROPIUM BROMIDE 0.02 % IN SOLN
0.5000 mg | Freq: Three times a day (TID) | RESPIRATORY_TRACT | Status: DC
  Administered 2016-03-23 – 2016-03-24 (×2): 0.5 mg via RESPIRATORY_TRACT
  Filled 2016-03-23 (×2): qty 2.5

## 2016-03-23 MED ORDER — PIPERACILLIN-TAZOBACTAM 3.375 G IVPB 30 MIN
3.3750 g | Freq: Once | INTRAVENOUS | Status: AC
  Administered 2016-03-23: 3.375 g via INTRAVENOUS
  Filled 2016-03-23: qty 50

## 2016-03-23 MED ORDER — BISACODYL 10 MG RE SUPP: 10.0000 mg | Freq: Every day | RECTAL | Status: DC | PRN

## 2016-03-23 MED ORDER — METHYLPREDNISOLONE SODIUM SUCC 125 MG IJ SOLR
125.0000 mg | Freq: Once | INTRAMUSCULAR | Status: AC
  Administered 2016-03-23: 125 mg via INTRAVENOUS
  Filled 2016-03-23: qty 2

## 2016-03-23 MED ORDER — SODIUM CHLORIDE 0.9 % IV BOLUS (SEPSIS)
1000.0000 mL | Freq: Once | INTRAVENOUS | Status: AC
  Administered 2016-03-23: 1000 mL via INTRAVENOUS

## 2016-03-23 MED ORDER — ADULT MULTIVITAMIN W/MINERALS CH
1.0000 | ORAL_TABLET | Freq: Every day | ORAL | Status: DC
  Administered 2016-03-23 – 2016-03-24 (×2): 1 via ORAL
  Filled 2016-03-23 (×2): qty 1

## 2016-03-23 MED ORDER — SODIUM CHLORIDE 0.9 % IV SOLN
INTRAVENOUS | Status: AC
  Administered 2016-03-23: 75 mL/h via INTRAVENOUS

## 2016-03-23 MED ORDER — DOCUSATE SODIUM 100 MG PO CAPS
100.0000 mg | ORAL_CAPSULE | Freq: Two times a day (BID) | ORAL | Status: DC
  Administered 2016-03-23 – 2016-03-25 (×5): 100 mg via ORAL
  Filled 2016-03-23 (×5): qty 1

## 2016-03-23 MED ORDER — PIPERACILLIN-TAZOBACTAM 3.375 G IVPB
3.3750 g | Freq: Three times a day (TID) | INTRAVENOUS | Status: DC
  Administered 2016-03-23 – 2016-03-24 (×4): 3.375 g via INTRAVENOUS
  Filled 2016-03-23 (×4): qty 50

## 2016-03-23 MED ORDER — ATENOLOL 25 MG PO TABS
12.5000 mg | ORAL_TABLET | Freq: Every day | ORAL | Status: DC
  Administered 2016-03-23 – 2016-03-25 (×3): 12.5 mg via ORAL
  Filled 2016-03-23 (×3): qty 1

## 2016-03-23 MED ORDER — LEVALBUTEROL HCL 0.63 MG/3ML IN NEBU
0.6300 mg | INHALATION_SOLUTION | Freq: Three times a day (TID) | RESPIRATORY_TRACT | Status: DC
  Administered 2016-03-23 – 2016-03-24 (×2): 0.63 mg via RESPIRATORY_TRACT
  Filled 2016-03-23 (×2): qty 3

## 2016-03-23 MED ORDER — IPRATROPIUM BROMIDE 0.02 % IN SOLN
0.5000 mg | Freq: Three times a day (TID) | RESPIRATORY_TRACT | Status: DC
  Administered 2016-03-23 (×2): 0.5 mg via RESPIRATORY_TRACT
  Filled 2016-03-23 (×2): qty 2.5

## 2016-03-23 MED ORDER — LEVALBUTEROL HCL 0.63 MG/3ML IN NEBU
0.6300 mg | INHALATION_SOLUTION | Freq: Three times a day (TID) | RESPIRATORY_TRACT | Status: DC
  Administered 2016-03-23 (×2): 0.63 mg via RESPIRATORY_TRACT
  Filled 2016-03-23 (×2): qty 3

## 2016-03-23 MED ORDER — RIVAROXABAN 15 MG PO TABS
15.0000 mg | ORAL_TABLET | Freq: Every day | ORAL | Status: DC
  Administered 2016-03-23 – 2016-03-24 (×2): 15 mg via ORAL
  Filled 2016-03-23 (×2): qty 1

## 2016-03-23 MED ORDER — IPRATROPIUM-ALBUTEROL 0.5-2.5 (3) MG/3ML IN SOLN
3.0000 mL | Freq: Once | RESPIRATORY_TRACT | Status: AC
  Administered 2016-03-23: 3 mL via RESPIRATORY_TRACT
  Filled 2016-03-23: qty 3

## 2016-03-23 MED ORDER — GUAIFENESIN ER 600 MG PO TB12
600.0000 mg | ORAL_TABLET | Freq: Two times a day (BID) | ORAL | Status: DC
  Administered 2016-03-23 – 2016-03-25 (×5): 600 mg via ORAL
  Filled 2016-03-23 (×5): qty 1

## 2016-03-23 MED ORDER — SODIUM CHLORIDE 0.9 % IV SOLN: INTRAVENOUS | Status: DC

## 2016-03-23 MED ORDER — POLYETHYLENE GLYCOL 3350 17 G PO PACK
17.0000 g | PACK | Freq: Every day | ORAL | Status: DC
  Administered 2016-03-23 – 2016-03-24 (×2): 17 g via ORAL
  Filled 2016-03-23 (×2): qty 1

## 2016-03-23 MED ORDER — LEVALBUTEROL HCL 1.25 MG/0.5ML IN NEBU
1.2500 mg | INHALATION_SOLUTION | Freq: Four times a day (QID) | RESPIRATORY_TRACT | Status: DC | PRN
  Filled 2016-03-23: qty 0.5

## 2016-03-23 MED ORDER — MEMANTINE HCL 5 MG PO TABS
5.0000 mg | ORAL_TABLET | Freq: Every day | ORAL | Status: DC
  Administered 2016-03-23 – 2016-03-25 (×3): 5 mg via ORAL
  Filled 2016-03-23 (×3): qty 1

## 2016-03-23 MED ORDER — VANCOMYCIN HCL IN DEXTROSE 1-5 GM/200ML-% IV SOLN
1000.0000 mg | Freq: Once | INTRAVENOUS | Status: AC
Start: 2016-03-23 — End: 2016-03-23
  Administered 2016-03-23: 1000 mg via INTRAVENOUS
  Filled 2016-03-23: qty 200

## 2016-03-23 NOTE — H&P (Signed)
History and Physical    BOSS DANIELSEN KCL:[id number] DOB: Dec 26, 1929 DOA: 03/23/2016   PCP: Thressa Sheller, MD   Patient coming from/Resides with: [facility]  Admission status: Inpatient/telemetry-medically necessary to stay a minimum 2 midnights to rule out impending and/or unexpected changes in physiologic status that may differ from initial evaluation performed in the ER and/or at time of admission. Patient presents with altered mentation, fever in the setting of coughing for several days with apparent plans to initiate Rocephin at the nursing facility. Upon presentation patient met sepsis criteria was appropriately treated in the ER and will continue gentle IV fluid rehydration for the next 12 hours. Clinical exam consistent with upper respiratory infection of uncertain etiology. Patient is resident of skilled nursing facility so will initially treat as HCAP with broad-spectrum antibiotics. Patient has a history of atrial fibrillation and is at high risk for developing RVR in setting of current infectious process. Because of this he will require telemetry monitoring.  Chief Complaint: Altered mental status and cough  HPI: Craig Bennett is a 82 y.o. male with medical history significant for dementia, chronic diastolic heart failure not on diuretics, history of saddle pulmonary embolus May 2017 on anticoagulation, chronic atrial fibrillation on anticoagulation, stage III chronic kidney disease and history of prostate cancer. Documentation is limited to information obtained by the EDP and patient has history of dementia so inconsistent historian. The patient apparently has had upper respiratory infection symptoms and it was documented that the patient was supposed to start Rocephin. He developed fever greater than 102 with altered mentation and worsening cough and was sent to the ER for evaluation. Patient reports that he knew he was feeling poorly but was not specific otherwise regarding history  of current symptoms. There is no documentation as to whether the patient was hypoxemic at the facility or pulmonary arrival to the ER and he is currently on nasal cannula oxygen. At presentation his serum lactate was greater than 2.0 and he had mild acute kidney injury and EDP felt patient met sepsis criteria so sepsis protocol was initiated in the ER. After fluid challenges and appropriate fluid resuscitation lactate has normalized and patient's vital signs are stable.  ED Course:  Vital Signs: BP 163/99   Pulse 77   Temp 102.9 F (39.4 C) (Rectal)   Resp 14   SpO2 98%  PCXR: No acute findings -shallow inspiration with mild atelectatic findings in bilateral bases  2V port Abd XR: Generous air throughout small and large bowel without evidence of bowel obstruction or perforation -also generous volume of colonic stool  Lab data: Sodium 134, potassium 4.5, BUN 15, creatinine 1.25, glucose 122, albumin 3.4, lactic acid 2.29 with repeat 1.74, WBC 6400 with neutrophils 83% absolute neutrophils 5.3%, hemoglobin 13.7, platelets 158,000, PT 19.2, INR 1.6, urinalysis unremarkable except for straw-colored appearance, small amount hemoglobin 5 ketones; blood cultures and urine culture obtained in the ER Medications and treatments: Normal saline bolus 2500 mL, Zosyn re-0.375 g IV 1, vancomycin 1 g IV 1, Solu-Medrol 125 mg IV 1, DuoNeb 1  Review of Systems:  In addition to the HPI above,  No Headache, changes with Vision or hearing, new weakness, tingling, numbness in any extremity, dizziness, dysarthria or word finding difficulty, gait disturbance or imbalance, tremors or seizure activity No problems swallowing food or Liquids, indigestion/reflux, choking or coughing while eating, abdominal pain with or after eating No Chest pain, Cough or Shortness of Breath, palpitations, orthopnea or DOE No Abdominal pain, N/V, melena,hematochezia, dark  tarry stools, constipation No dysuria, malodorous urine,  hematuria or flank pain No new skin rashes, lesions, masses or bruises, No new joint pains, aches, swelling or redness No recent unintentional weight gain or loss No polyuria, polydypsia or polyphagia   Past Medical History:  Diagnosis Date  . A-fib ([place])    a. CHA2DS2VASc = 2, prev on coumadin but d/c'd ~ 07/2014 - told he was in sinus rhythm;  b. 09/2014 amio d/c'd in setting of persistent afib and bradycardia  . Chronic diastolic CHF (congestive heart failure) ([place])   . CKD (chronic kidney disease), stage II   . COPD (chronic obstructive pulmonary disease) ([place])   . Dementia    a. 09/2014 Aricept d/c'd in setting of bradycardia.  Marland Kitchen History of echocardiogram    a. 11/2008 Echo: EF 60-65%, no rwma, mild LVH, triv AI, mild MR, mildly dil LA/RA.  Marland Kitchen Low testosterone   . PE (pulmonary embolism)   . Renal insufficiency   . Syncope    a. 09/2014    Past Surgical History:  Procedure Laterality Date  . NASAL HEMORRHAGE CONTROL N/A 12/20/2015   Procedure: NASAL ENDOSCOPY EPISTAXIS CONTROL;  Surgeon: Rozetta Nunnery, MD;  Location: [facility];  Service: ENT;  Laterality: N/A;    Social History   Social History  . Marital status: Divorced    Spouse name: N/A  . Number of children: N/A  . Years of education: N/A   Occupational History  . Not on file.   Social History Main Topics  . Smoking status: Never Smoker  . Smokeless tobacco: Never Used  . Alcohol use Yes     Comment: "very little, occasional wine with food."  . Drug use: No  . Sexual activity: Not on file   Other Topics Concern  . Not on file   Social History Narrative   Lives in [place] by himself - [facility].  Retired Corporate treasurer professor (PHD).  Prefers to be called Dr. [name].  Does not routinely exercise.  Active in his church choir.    Mobility: Without assistive devices Work history: Retired   No Known Allergies  Family History  Problem Relation Age of Onset  . Other      no premature CAD.     Prior to  Admission medications   Medication Sig Start Date End Date Taking? Authorizing Provider  acetaminophen (TYLENOL) 325 MG tablet Take 2 tablets (650 mg total) by mouth every 6 (six) hours as needed for mild pain (or Fever >/= 101). Patient taking differently: Take 650 mg by mouth every 6 (six) hours as needed for mild pain.  08/10/15  Yes Robbie Lis, MD  atenolol (TENORMIN) 25 MG tablet Take 0.5 tablets (12.5 mg total) by mouth daily. 08/10/15  Yes Robbie Lis, MD  guaiFENesin (MUCINEX) 600 MG 12 hr tablet Take 600 mg by mouth 2 (two) times daily.   Yes Historical Provider, MD  memantine (NAMENDA) 5 MG tablet Take 1 tablet (5 mg total) by mouth daily. 10/25/14  Yes Charlynne Cousins, MD  rivaroxaban (XARELTO) 20 MG TABS tablet Take 1 tablet (20 mg total) by mouth daily with supper. Patient taking differently: Take 15 mg by mouth daily with supper.  08/31/15  Yes Robbie Lis, MD  therapeutic multivitamin-minerals [facility]) tablet Take 1 tablet by mouth at bedtime.   Yes Historical Provider, MD    Physical Exam: Vitals:   03/23/16 0346 03/23/16 0507 03/23/16 0515 03/23/16 0530  BP: (!) 189/104 180/92 Marland Kitchen)  142/106 163/99  Pulse: 89  82 77  Resp: '21 23 21 14  ' Temp:      TempSrc:      SpO2: 96%  99% 98%      Constitutional: NAD, calm, comfortable Eyes: PERRL, lids and conjunctivae normal ENMT: Mucous membranes are dry. Posterior pharynx clear of any exudate or lesions.Normal dentition.  Neck: normal, supple, no masses, no thyromegaly Respiratory: Bilateral crackles throughout all lung fields to the bases with right greater than left, a few expiratory wheezes in the bases, previous tachypnea has resolved, normal respiratory effort without increased work of breathing, nasal cannula oxygen in place Cardiovascular: Irregular rate, atrial fibrillation, no murmurs / rubs / gallops. No extremity edema but evidence of hemosiderin changes consistent with prior chronic edema. 2+ pedal pulses. No  carotid bruits.  Abdomen: no tenderness, no masses palpated. No hepatosplenomegaly. Bowel sounds positive.  Musculoskeletal: no clubbing / cyanosis. No joint deformity upper and lower extremities. Good ROM, no contractures. Normal muscle tone.  Skin: no rashes, lesions, ulcers. No induration Neurologic: CN 2-12 grossly intact. Sensation intact, DTR normal. Strength 5/5 x all 4 extremities.  Psychiatric: Alert and oriented x 3. Normal mood. There appears to be some mild short-term memory deficits   Labs on Admission: I have personally reviewed following labs and imaging studies  CBC:  Recent Labs Lab 03/23/16 0116  WBC 6.4  NEUTROABS 5.3  HGB 13.7  HCT 42.2  MCV 92.1  PLT 017   Basic Metabolic Panel:  Recent Labs Lab 03/23/16 0116  NA 134*  K 4.5  CL 104  CO2 23  GLUCOSE 122*  BUN 15  CREATININE 1.25*  CALCIUM 9.1   GFR: CrCl cannot be calculated (Unknown ideal weight.). Liver Function Tests:  Recent Labs Lab 03/23/16 0116  AST 32  ALT 16*  ALKPHOS 68  BILITOT 0.8  PROT 6.4*  ALBUMIN 3.4*   No results for input(s): LIPASE, AMYLASE in the last 168 hours. No results for input(s): AMMONIA in the last 168 hours. Coagulation Profile:  Recent Labs Lab 03/23/16 0116  INR 1.60   Cardiac Enzymes: No results for input(s): CKTOTAL, CKMB, CKMBINDEX, TROPONINI in the last 168 hours. BNP (last 3 results) No results for input(s): PROBNP in the last 8760 hours. HbA1C: No results for input(s): HGBA1C in the last 72 hours. CBG: No results for input(s): GLUCAP in the last 168 hours. Lipid Profile: No results for input(s): CHOL, HDL, LDLCALC, TRIG, CHOLHDL, LDLDIRECT in the last 72 hours. Thyroid Function Tests: No results for input(s): TSH, T4TOTAL, FREET4, T3FREE, THYROIDAB in the last 72 hours. Anemia Panel: No results for input(s): VITAMINB12, FOLATE, FERRITIN, TIBC, IRON, RETICCTPCT in the last 72 hours. Urine analysis:    Component Value Date/Time    COLORURINE STRAW (A) 03/23/2016 0430   APPEARANCEUR CLEAR 03/23/2016 0430   LABSPEC 1.012 03/23/2016 0430   PHURINE 6.0 03/23/2016 0430   GLUCOSEU NEGATIVE 03/23/2016 0430   HGBUR SMALL (A) 03/23/2016 0430   BILIRUBINUR NEGATIVE 03/23/2016 0430   KETONESUR 5 (A) 03/23/2016 0430   PROTEINUR NEGATIVE 03/23/2016 0430   UROBILINOGEN 1.0 10/23/2014 1829   NITRITE NEGATIVE 03/23/2016 0430   LEUKOCYTESUR NEGATIVE 03/23/2016 0430   Sepsis Labs: '@LABRCNTIP' (procalcitonin:4,lacticidven:4) )No results found for this or any previous visit (from the past 240 hour(s)).   Radiological Exams on Admission: Dg Chest Port 1 View  Result Date: 03/23/2016 CLINICAL DATA:  Sepsis and fever EXAM: PORTABLE CHEST 1 VIEW COMPARISON:  08/06/2015 FINDINGS: Shallow inspiration. No confluent airspace  consolidation. No effusions. Mild atelectatic lung base opacities, accentuated by the shallow inspiration. Hilar, mediastinal and cardiac contours are unremarkable and unchanged. Pulmonary vasculature is normal. IMPRESSION: No acute findings. Shallow inspiration with mild atelectatic appearing linear lung base opacities. Electronically Signed   By: Andreas Newport M.D.   On: 03/23/2016 02:08   Dg Abd Portable 2 Views  Result Date: 03/23/2016 CLINICAL DATA:  Sepsis and fever EXAM: PORTABLE ABDOMEN - 2 VIEW COMPARISON:  08/06/2015 CT FINDINGS: Generous volume of air throughout small and large bowel. No radiographic evidence of bowel obstruction or perforation. Generous volume colonic stool. No biliary or urinary calculi are evident. IMPRESSION: Generous volume air throughout small and large bowel without evidence of obstruction or perforation. Electronically Signed   By: Andreas Newport M.D.   On: 03/23/2016 02:09    EKG: (Independently reviewed) atrial fibrillation with ventricular rate 78 bpm, QTC 433 ms, no acute ischemic changes  Assessment/Plan Principal Problem:   Sepsis  -Patient presents with mild sepsis  physiology that has resolved with appropriate treatment in the ER consisting of volume resuscitation, oxygen therapy and antibiotics -Sepsis order set initiated in the ER -Source appears to be respiratory -Treat underlying causes -Normotensive and lactate has normalized and therefore no indication to be aggressive with volume resuscitation  Sepsis - Repeat Assessment  Performed at:    730 am  Vitals     Blood pressure 163/99, pulse 77, temperature 102.9 F (39.4 C), temperature source Rectal, resp. rate 14, SpO2 98 %.  Heart:     Irregular without tachycardia, normotensive, no JVD  Lungs:    Bilateral crackles with a few wheezes, nasal cannula oxygen in place, no tachypnea, normal work of breathing  Capillary Refill:   Normal with rapid refill less then 1-2 seconds  Peripheral Pulse:   All peripheral pulses easily palpable at 2+  Skin:     Skin warm and dry and patient with overall good color and appearance   Active Problems:   AKI (acute kidney injury) /CKD (chronic kidney disease), stage III -Baseline: 13/0.91 -Current:  15/1.25 -Follow chemistries    Cough -Patient presents with cough of uncertain duration -No focal infiltrates on chest x-ray, no leukocytosis but due to severity of symptoms at presentation I'll initially treat as HCAP -Pneumonia order set initiated -Suspect viral etiology so check respiratory viral panel and provide supportive care -Procalcitonin -Mucinex -Has been having some mild expiratory wheezing so provide scheduled Xopenex initially -Sputum culture -Follow up on blood cultures -2 view chest x-ray in a.m. after hydration    Chronic atrial fibrillation  -Stable without RVR -Continue Xarelto -chadvasc=4    Acute metabolic encephalopathy -2/2 fever, acute infectious processes and likely undetected hypoxemia nursing facility    Chronic diastolic heart failure, NYHA class 1  -Currently compensated -Cautious administration of IV fluids for 12  hours only    Dementia -Continue Namenda -Monitor for acute delirium and setting of acute infectious process and change in surroundings    H/O prostate cancer -No documentation of urinary difficulties    History of pulmonary embolism -Continue Xarelto    Constipation -Begin Colace and nocturnal MiraLAX -Dulcolax suppository prn       DVT prophylaxis: Xarelto Code Status: Full Family Communication: No family at bedside  Disposition Plan: Anticipate return back to SNF when medically stable Consults called: None    ELLIS,ALLISON L. ANP-BC [facility] Pager [phone]   If 7PM-7AM, please contact night-coverage [email or web address] Password [facility]  03/23/2016, 7:43 AM

## 2016-03-23 NOTE — Progress Notes (Signed)
Pharmacy Antibiotic Note  Craig Bennett is a 82 y.o. male admitted on 03/23/2016 with sepsis.  Pharmacy has been consulted for Vancomycin/Zosyn dosing. From nursing facility with altered mental status. Pt is febrile. WBC WNL. CrCl ~45-50.   Plan: Vancomycin 1250 mg IV q24h Zosyn 3.375G IV q8h to be infused over 4 hours Trend WBC, temp, renal function  F/U infectious work-up Drug levels as indicated  Temp (24hrs), Avg:102.9 F (39.4 C), Min:102.9 F (39.4 C), Max:102.9 F (39.4 C)   Recent Labs Lab 03/23/16 0116 03/23/16 0215 03/23/16 0402  WBC 6.4  --   --   CREATININE 1.25*  --   --   LATICACIDVEN  --  2.29* 1.74    CrCl cannot be calculated (Unknown ideal weight.).    No Known Allergies  Craig Bennett, Craig Bennett 03/23/2016 4:20 AM

## 2016-03-23 NOTE — Progress Notes (Signed)
CRITICAL VALUE ALERT  Critical value received: Respiratory panel result:  Respiratory Syncytial Virus  Date of notification: 03/23/16  Time of notification:  1520  Critical value read back:Yes.    Nurse who received alert:  Sheppard Evensina Danarius Mcconathy  MD notified (1st page):  Junious SilkAllison Ellis  Time of first page:  1525  MD notified (2nd page):  Time of second page:  Responding MD:    Time MD responded:

## 2016-03-23 NOTE — ED Notes (Signed)
Pt's friend at bedside.  States pt is normally not oriented to year and appears to be at his norm.  Pt interacting with friend, following commands.

## 2016-03-23 NOTE — Progress Notes (Signed)
  Mr. Craig Bennett is a  82 year old male with pmh A. fib, diastolic CHF, PE, dementia; who presents from nursing home with 2 days of AMS and cough. Patient developed 102F, tachypneic, and Lactic acid 2.3. CXR initially read as air under the diaphragm. Received Solu-Medrol, hour-long albuterol treatment. Question respiratory etiology such as viral syndrome. Patient was placed on empiric antibiotics. Transferred to stepdown due to respiratory distress. Note patient answer to everything is no.

## 2016-03-23 NOTE — ED Provider Notes (Signed)
[facility]-EMERGENCY DEPT Provider Note   CSN: [id number] Arrival date & time: 03/23/16  6295   By signing my name below, I, Bobbie Stack, attest that this documentation has been prepared under the direction and in the presence of Glynn Octave, MD. Electronically Signed: Bobbie Stack, Scribe. 03/23/16. 1:05 AM. History   Chief Complaint Chief Complaint  Patient presents with  . Altered Mental Status     The history is provided by the patient. No language interpreter was used.    LEVEL 5 CAVEAT: Dementia and AMS HPI Comments: JABARRI STEFANELLI is a 82 y.o. male who presents to the Emergency Department from [facility] with altered mental status since earlier today. Patient reports that he has a productive cough. Patient denies CP, abdominal pain, headache, and vomiting. He has a hx of PE and A-fib. He is currently taking Xarelto. He also denies hx of smoking and COPD. Nursing home performed a CXR which showed "curvilinear lucency at the level of both diaphragms, more prominent on the right side. Free air must be considered."   Past Medical History:  Diagnosis Date  . A-fib (HCC)    a. CHA2DS2VASc = 2, prev on coumadin but d/c'd ~ 07/2014 - told he was in sinus rhythm;  b. 09/2014 amio d/c'd in setting of persistent afib and bradycardia  . Chronic diastolic CHF (congestive heart failure) (HCC)   . CKD (chronic kidney disease), stage II   . COPD (chronic obstructive pulmonary disease) (HCC)   . Dementia    a. 09/2014 Aricept d/c'd in setting of bradycardia.  Marland Kitchen History of echocardiogram    a. 11/2008 Echo: EF 60-65%, no rwma, mild LVH, triv AI, mild MR, mildly dil LA/RA.  Marland Kitchen Low testosterone   . PE (pulmonary embolism)   . Renal insufficiency   . Syncope    a. 09/2014    Patient Active Problem List   Diagnosis Date Noted  . Pulmonary embolus (HCC) 08/06/2015  . AKI (acute kidney injury) (HCC) 10/24/2014  . Atrial fibrillation, unspecified   . Syncope and  collapse   . Syncope 10/22/2014  . Dementia 10/22/2014  . Chronic atrial fibrillation (HCC) 10/22/2014  . Orthostatic hypotension 10/22/2014  . Symptomatic bradycardia 10/22/2014  . Low testosterone 10/22/2014  . CKD (chronic kidney disease), stage III 10/22/2014  . H/O prostate cancer 10/22/2014    Past Surgical History:  Procedure Laterality Date  . NASAL HEMORRHAGE CONTROL N/A 12/20/2015   Procedure: NASAL ENDOSCOPY EPISTAXIS CONTROL;  Surgeon: Drema Halon, MD;  Location: [facility];  Service: ENT;  Laterality: N/A;       Home Medications    Prior to Admission medications   Medication Sig Start Date End Date Taking? Authorizing Provider  acetaminophen (TYLENOL) 325 MG tablet Take 2 tablets (650 mg total) by mouth every 6 (six) hours as needed for mild pain (or Fever >/= 101). Patient taking differently: Take 650 mg by mouth every 6 (six) hours as needed for mild pain.  08/10/15   Alison Murray, MD  aspirin 81 MG tablet Take 81 mg by mouth at bedtime.     Historical Provider, MD  atenolol (TENORMIN) 25 MG tablet Take 0.5 tablets (12.5 mg total) by mouth daily. 08/10/15   Alison Murray, MD  doxycycline (VIBRA-TABS) 100 MG tablet Take 1 tablet (100 mg total) by mouth 2 (two) times daily. Patient not taking: Reported on 12/25/2015 08/10/15   Alison Murray, MD  memantine (NAMENDA) 5 MG tablet Take 1 tablet (  5 mg total) by mouth daily. 10/25/14   Marinda ElkAbraham Feliz Ortiz, MD  PRESCRIPTION MEDICATION Take 120 mLs by mouth 2 (two) times daily. Med Pass 2.0    Historical Provider, MD  rivaroxaban (XARELTO) 20 MG TABS tablet Take 1 tablet (20 mg total) by mouth daily with supper. 08/31/15   Alison MurrayAlma M Devine, MD  Skin Protectants, Misc. (EUCERIN) cream Apply 1 application topically every morning.     Historical Provider, MD  therapeutic multivitamin-minerals (THERAGRAN-M) tablet Take 1 tablet by mouth at bedtime.    Historical Provider, MD    Family History Family History  Problem Relation Age of  Onset  . Other      no premature CAD.    Social History Social History  Substance Use Topics  . Smoking status: Never Smoker  . Smokeless tobacco: Never Used  . Alcohol use Yes     Comment: "very little, occasional wine with food."     Allergies   Patient has no known allergies.   Review of Systems Review of Systems  Unable to perform ROS: Dementia (Altered mental status)     Physical Exam Updated Vital Signs BP 143/96 (BP Location: Right Arm)   Pulse 91   Temp 99.5 F (37.5 C) (Oral)   Resp 20   SpO2 95%   Physical Exam  Constitutional: He appears well-developed and well-nourished.  Ill appearing.  HENT:  Head: Normocephalic and atraumatic.  Mouth/Throat: Oropharynx is clear and moist. No oropharyngeal exudate.  Eyes: Conjunctivae and EOM are normal. Pupils are equal, round, and reactive to light.  Neck: Normal range of motion. Neck supple.  No meningismus.  Cardiovascular: Normal rate, regular rhythm, normal heart sounds and intact distal pulses.   No murmur heard. Pulmonary/Chest: Effort normal and breath sounds normal. No respiratory distress.  Coarse rhonchi throughout with scattered wheezing.    Abdominal: Soft. There is no rebound and no guarding.  Abdomen is soft, non tender.  Musculoskeletal: Normal range of motion. He exhibits no edema or tenderness.  Neurological: He is alert. No cranial nerve deficit. He exhibits normal muscle tone. Coordination normal.   5/5 strength throughout. CN 2-12 intact.Equal grip strength.   Oriented x1  He follows commands.  Skin: Skin is warm.  Psychiatric: He has a normal mood and affect. His behavior is normal.  Nursing note and vitals reviewed.    ED Treatments / Results  DIAGNOSTIC STUDIES: Oxygen Saturation is 98% on [place], normal by my interpretation.    COORDINATION OF CARE: 1:16 AM Discussed treatment plan with pt at bedside and pt agreed to plan.  Labs (all labs ordered are listed, but only abnormal  results are displayed) Labs Reviewed  COMPREHENSIVE METABOLIC PANEL - Abnormal; Notable for the following:       Result Value   Sodium 134 (*)    Glucose, Bld 122 (*)    Creatinine, Ser 1.25 (*)    Total Protein 6.4 (*)    Albumin 3.4 (*)    ALT 16 (*)    GFR calc non Af Amer 51 (*)    GFR calc Af Amer 59 (*)    All other components within normal limits  PROTIME-INR - Abnormal; Notable for the following:    Prothrombin Time 19.2 (*)    All other components within normal limits  URINALYSIS, ROUTINE W REFLEX MICROSCOPIC - Abnormal; Notable for the following:    Color, Urine STRAW (*)    Hgb urine dipstick SMALL (*)    Ketones, ur  5 (*)    All other components within normal limits  I-STAT CG4 LACTIC ACID, ED - Abnormal; Notable for the following:    Lactic Acid, Venous 2.29 (*)    All other components within normal limits  I-STAT ARTERIAL BLOOD GAS, ED - Abnormal; Notable for the following:    Acid-base deficit 3.0 (*)    All other components within normal limits  CULTURE, BLOOD (ROUTINE X 2)  CULTURE, BLOOD (ROUTINE X 2)  URINE CULTURE  RESPIRATORY PANEL BY PCR  CBC WITH DIFFERENTIAL/PLATELET  PROCALCITONIN  I-STAT BETA HCG BLOOD, ED ([facility], [facility], [facility] ONLY)  I-STAT CG4 LACTIC ACID, ED  I-STAT CG4 LACTIC ACID, ED  I-STAT CG4 LACTIC ACID, ED    EKG  EKG Interpretation  Date/Time:  Sunday March 23 2016 01:23:48 EST Ventricular Rate:  78 PR Interval:    QRS Duration: 92 QT Interval:  390 QTC Calculation: 433 R Axis:   42 Text Interpretation:  Atrial fibrillation Nonspecific repol abnormality, lateral leads Minimal ST elevation, lateral leads Baseline wander in lead(s) V2 Artifact No significant change was found Confirmed by Manus Gunning  MD, Nasirah Sachs [phone]) on 03/23/2016 1:29:15 AM       Radiology Dg Chest Port 1 View  Result Date: 03/23/2016 CLINICAL DATA:  Sepsis and fever EXAM: PORTABLE CHEST 1 VIEW COMPARISON:  08/06/2015 FINDINGS: Shallow inspiration. No confluent  airspace consolidation. No effusions. Mild atelectatic lung base opacities, accentuated by the shallow inspiration. Hilar, mediastinal and cardiac contours are unremarkable and unchanged. Pulmonary vasculature is normal. IMPRESSION: No acute findings. Shallow inspiration with mild atelectatic appearing linear lung base opacities. Electronically Signed   By: Ellery Plunk M.D.   On: 03/23/2016 02:08   Dg Abd Portable 2 Views  Result Date: 03/23/2016 CLINICAL DATA:  Sepsis and fever EXAM: PORTABLE ABDOMEN - 2 VIEW COMPARISON:  08/06/2015 CT FINDINGS: Generous volume of air throughout small and large bowel. No radiographic evidence of bowel obstruction or perforation. Generous volume colonic stool. No biliary or urinary calculi are evident. IMPRESSION: Generous volume air throughout small and large bowel without evidence of obstruction or perforation. Electronically Signed   By: Ellery Plunk M.D.   On: 03/23/2016 02:09    Procedures Procedures (including critical care time)  Medications Ordered in ED Medications - No data to display   Initial Impression / Assessment and Plan / ED Course  I have reviewed the triage vital signs and the nursing notes.  Pertinent labs & imaging results that were available during my care of the patient were reviewed by me and considered in my medical decision making (see chart for details).  Clinical Course    Patient from nursing home with fever and altered mental status. He had x-ray performed at nursing home which was concerning for free air under the diaphragm. Patient denies any chest pain or abdominal pain. He denies any shortness of breath. He is febrile and tachypnea. Code sepsis activated.  IVF and antibiotics given after blood cultures obtained. CXR without infiltrate. UA negative. Viral respiratory source suspected. Flu swab sent.   WOrk of breathing improved with nebs and steroids. ABG ok.   Airway and mental status stable during ED course.  Admission to stepdown for sepsis d/w Dr. Katrinka Blazing.  CRITICAL CARE Performed by: Glynn Octave Total critical care time: 45 minutes Critical care time was exclusive of separately billable procedures and treating other patients. Critical care was necessary to treat or prevent imminent or life-threatening deterioration. Critical care was time spent personally by me  on the following activities: development of treatment plan with patient and/or surrogate as well as nursing, discussions with consultants, evaluation of patient's response to treatment, examination of patient, obtaining history from patient or surrogate, ordering and performing treatments and interventions, ordering and review of laboratory studies, ordering and review of radiographic studies, pulse oximetry and re-evaluation of patient's condition.  Final Clinical Impressions(s) / ED Diagnoses   Final diagnoses:  Sepsis, due to unspecified organism (HCC)  Altered mental status, unspecified altered mental status type    New Prescriptions New Prescriptions   No medications on file  I personally performed the services described in this documentation, which was scribed in my presence. The recorded information has been reviewed and is accurate.    Glynn OctaveStephen Ezabella Teska, MD 03/23/16 0930

## 2016-03-23 NOTE — ED Triage Notes (Signed)
Pt here from [facility], pt sent intiially for xray reading of "free air" pt has course lung sounds through out, pt altered, hot to touch, rectal temp 102.9 staff says decreased appetite and activity. Pt answers questions appropriately, was supposed to start rocephin at facility but did not. Unknown if had a fall at bathroom today, pt is on xarelto. Here by ems.

## 2016-03-24 ENCOUNTER — Inpatient Hospital Stay (HOSPITAL_COMMUNITY): Payer: Medicare PPO

## 2016-03-24 DIAGNOSIS — Z8546 Personal history of malignant neoplasm of prostate: Secondary | ICD-10-CM

## 2016-03-24 DIAGNOSIS — Z86711 Personal history of pulmonary embolism: Secondary | ICD-10-CM

## 2016-03-24 DIAGNOSIS — N179 Acute kidney failure, unspecified: Secondary | ICD-10-CM

## 2016-03-24 DIAGNOSIS — R05 Cough: Secondary | ICD-10-CM

## 2016-03-24 DIAGNOSIS — N183 Chronic kidney disease, stage 3 (moderate): Secondary | ICD-10-CM

## 2016-03-24 DIAGNOSIS — I5032 Chronic diastolic (congestive) heart failure: Secondary | ICD-10-CM

## 2016-03-24 DIAGNOSIS — R001 Bradycardia, unspecified: Secondary | ICD-10-CM

## 2016-03-24 DIAGNOSIS — I482 Chronic atrial fibrillation: Secondary | ICD-10-CM

## 2016-03-24 DIAGNOSIS — G9341 Metabolic encephalopathy: Secondary | ICD-10-CM

## 2016-03-24 LAB — CBC WITH DIFFERENTIAL/PLATELET
Basophils Absolute: 0 10*3/uL (ref 0.0–0.1)
Basophils Relative: 0 %
Eosinophils Absolute: 0 10*3/uL (ref 0.0–0.7)
Eosinophils Relative: 0 %
HCT: 40 % (ref 39.0–52.0)
Hemoglobin: 12.8 g/dL — ABNORMAL LOW (ref 13.0–17.0)
Lymphocytes Relative: 11 %
Lymphs Abs: 1 10*3/uL (ref 0.7–4.0)
MCH: 29.7 pg (ref 26.0–34.0)
MCHC: 32 g/dL (ref 30.0–36.0)
MCV: 92.8 fL (ref 78.0–100.0)
Monocytes Absolute: 0.4 10*3/uL (ref 0.1–1.0)
Monocytes Relative: 5 %
Neutro Abs: 7 10*3/uL (ref 1.7–7.7)
Neutrophils Relative %: 84 %
Platelets: 144 10*3/uL — ABNORMAL LOW (ref 150–400)
RBC: 4.31 MIL/uL (ref 4.22–5.81)
RDW: 14.9 % (ref 11.5–15.5)
WBC: 8.5 10*3/uL (ref 4.0–10.5)

## 2016-03-24 LAB — BASIC METABOLIC PANEL
Anion gap: 4 — ABNORMAL LOW (ref 5–15)
BUN: 12 mg/dL (ref 6–20)
CO2: 28 mmol/L (ref 22–32)
Calcium: 9.6 mg/dL (ref 8.9–10.3)
Chloride: 106 mmol/L (ref 101–111)
Creatinine, Ser: 0.98 mg/dL (ref 0.61–1.24)
GFR calc Af Amer: >60 mL/min (ref >60)
GFR calc non Af Amer: >60 mL/min (ref >60)
Glucose, Bld: 116 mg/dL — ABNORMAL HIGH (ref 65–99)
Potassium: 4.1 mmol/L (ref 3.5–5.1)
Sodium: 138 mmol/L (ref 135–145)

## 2016-03-24 LAB — BLOOD CULTURE ID PANEL (REFLEXED)

## 2016-03-24 LAB — URINE CULTURE: CULTURE: NO GROWTH

## 2016-03-24 MED ORDER — LEVALBUTEROL HCL 0.63 MG/3ML IN NEBU
0.6300 mg | INHALATION_SOLUTION | Freq: Two times a day (BID) | RESPIRATORY_TRACT | Status: DC
  Administered 2016-03-24 – 2016-03-25 (×2): 0.63 mg via RESPIRATORY_TRACT
  Filled 2016-03-24 (×2): qty 3

## 2016-03-24 MED ORDER — VANCOMYCIN HCL IN DEXTROSE 750-5 MG/150ML-% IV SOLN: 750.0000 mg | Freq: Two times a day (BID) | INTRAVENOUS | Status: DC

## 2016-03-24 MED ORDER — IPRATROPIUM BROMIDE 0.02 % IN SOLN
0.5000 mg | Freq: Two times a day (BID) | RESPIRATORY_TRACT | Status: DC
  Administered 2016-03-24 – 2016-03-25 (×2): 0.5 mg via RESPIRATORY_TRACT
  Filled 2016-03-24 (×2): qty 2.5

## 2016-03-24 MED ORDER — LEVOFLOXACIN 750 MG PO TABS
750.0000 mg | ORAL_TABLET | Freq: Every day | ORAL | Status: DC
  Administered 2016-03-24 – 2016-03-25 (×2): 750 mg via ORAL
  Filled 2016-03-24 (×2): qty 1

## 2016-03-24 NOTE — Progress Notes (Signed)
CCMD called to advise patient had a 2.21 sec pause. Will continue to monitor.

## 2016-03-24 NOTE — Progress Notes (Signed)
PROGRESS NOTE    Craig Bennett  HOZ:[id number] DOB: 18-Mar-1930 DOA: 03/23/2016 PCP: Thressa Sheller, MD   Chief Complaint  Patient presents with  . Altered Mental Status    Brief Narrative:  HPI on 03/23/2016 by [name], NP Craig Bennett is a 82 y.o. male with medical history significant for dementia, chronic diastolic heart failure not on diuretics, history of saddle pulmonary embolus May 2017 on anticoagulation, chronic atrial fibrillation on anticoagulation, stage III chronic kidney disease and history of prostate cancer. Documentation is limited to information obtained by the EDP and patient has history of dementia so inconsistent historian. The patient apparently has had upper respiratory infection symptoms and it was documented that the patient was supposed to start Rocephin. He developed fever greater than 102 with altered mentation and worsening cough and was sent to the ER for evaluation. Patient reports that he knew he was feeling poorly but was not specific otherwise regarding history of current symptoms. There is no documentation as to whether the patient was hypoxemic at the facility or pulmonary arrival to the ER and he is currently on nasal cannula oxygen. At presentation his serum lactate was greater than 2.0 and he had mild acute kidney injury and EDP felt patient met sepsis criteria so sepsis protocol was initiated in the ER. After fluid challenges and appropriate fluid resuscitation lactate has normalized and patient's vital signs are stable. Assessment & Plan  Sepsis secondary to URI/?HCAP -Patient presents with mild sepsis physiology that has resolved with appropriate treatment in the ER consisting of volume resuscitation, oxygen therapy and antibiotics -Sepsis order set initiated in the ER -Source appears to be respiratory -CXR right mid-lower lobe opacification likely atelectasis -Respiratory viral panel +RSV -Influenza and urine strep/legionella antigens  negative -Continue supportive care -Currently on vanc/zosyn empirically -Blood cultures pending  -Continue mcuinex, nebs  AKI (acute kidney injury) /CKD (chronic kidney disease), stage III -Secondary to sepsis -Baseline Creatinine 0.91, was 1.25 upon admission -Currenlty 0.98 -Continue to monitor BMP  Chronic atrial fibrillation  -Stable without RVR -Continue Xarelto -chadvasc=4  Acute metabolic encephalopathy -Secondary to sepsis -Continue to monitor   Chronic diastolic heart failure, NYHA class 1  -Currently compensated -Last echo 08/07/2015: EF 55% -Received gentle IVF due to sepsis -Monitor intake/output, daily weights  Dementia -Continue Namenda -Monitor for acute delirium and setting of acute infectious process and change in surroundings  H/O prostate cancer -No documentation of urinary difficulties  History of pulmonary embolism -Continue Xarelto  Constipation -Continue bowel regimen  DVT Prophylaxis  Xarelto  Code Status: Full  Family Communication: None at bedside  Disposition Plan: Admitted. Continue to monitor in stepdown.  D/c to SNF when stable.  Consultants None  Procedures  none  Antibiotics   Anti-infectives    Start     Dose/Rate Route Frequency Ordered Stop   03/23/16 2200  vancomycin (VANCOCIN) 1,250 mg in sodium chloride 0.9 % 250 mL IVPB     1,250 mg 166.7 mL/hr over 90 Minutes Intravenous Every 24 hours 03/23/16 0423     03/23/16 1000  piperacillin-tazobactam (ZOSYN) IVPB 3.375 g     3.375 g 12.5 mL/hr over 240 Minutes Intravenous Every 8 hours 03/23/16 0423     03/23/16 0130  piperacillin-tazobactam (ZOSYN) IVPB 3.375 g     3.375 g 100 mL/hr over 30 Minutes Intravenous  Once 03/23/16 0115 03/23/16 0228   03/23/16 0130  vancomycin (VANCOCIN) IVPB 1000 mg/200 mL premix     1,000 mg 200 mL/hr over  60 Minutes Intravenous  Once 03/23/16 0115 03/23/16 0304      Subjective:   Craig Bennett seen and examined today.  Continues  to have cough, but feels breathing is improved.  Denies chest pain, abdominal pain, N/V/D.   Objective:   Vitals:   03/24/16 0300 03/24/16 0749 03/24/16 0812 03/24/16 1022  BP: (!) 124/96 (!) 130/103  (!) 151/80  Pulse: (!) 55 78  93  Resp: 20 (!) 21    Temp: 97.7 F (36.5 C) 98.3 F (36.8 C)    TempSrc:  Oral    SpO2: (!) 84% 95% 94%   Weight: 81.9 kg (180 lb 8 oz)     Height:        Intake/Output Summary (Last 24 hours) at 03/24/16 1051 Last data filed at 03/24/16 0544  Gross per 24 hour  Intake          1721.25 ml  Output             1425 ml  Net           296.25 ml   Filed Weights   03/23/16 1300 03/24/16 0300  Weight: 81.5 kg (179 lb 9.6 oz) 81.9 kg (180 lb 8 oz)    Exam  General: Well developed, well nourished, NAD, appears stated age  HEENT: NCAT, mucous membranes moist.   Cardiovascular: S1 S2 auscultated, no rubs, murmurs or gallops. IRR  Respiratory: Diminished breath sounds, +Exp wheezing, +cough  Abdomen: Soft, nontender, nondistended, + bowel sounds  Extremities: warm dry without cyanosis clubbing or edema  Neuro: Awake and alert, oriented x 3. nonfocal  Psych: Normal affect and demeanor with intact judgement and insight   Data Reviewed: I have personally reviewed following labs and imaging studies  CBC:  Recent Labs Lab 03/23/16 0116 03/24/16 0345  WBC 6.4 8.5  NEUTROABS 5.3 7.0  HGB 13.7 12.8*  HCT 42.2 40.0  MCV 92.1 92.8  PLT 158 502*   Basic Metabolic Panel:  Recent Labs Lab 03/23/16 0116 03/24/16 0345  NA 134* 138  K 4.5 4.1  CL 104 106  CO2 23 28  GLUCOSE 122* 116*  BUN 15 12  CREATININE 1.25* 0.98  CALCIUM 9.1 9.6   GFR: Estimated Creatinine Clearance: 61.6 mL/min (by C-G formula based on SCr of 0.98 mg/dL). Liver Function Tests:  Recent Labs Lab 03/23/16 0116  AST 32  ALT 16*  ALKPHOS 68  BILITOT 0.8  PROT 6.4*  ALBUMIN 3.4*   No results for input(s): LIPASE, AMYLASE in the last 168 hours. No results  for input(s): AMMONIA in the last 168 hours. Coagulation Profile:  Recent Labs Lab 03/23/16 0116  INR 1.60   Cardiac Enzymes: No results for input(s): CKTOTAL, CKMB, CKMBINDEX, TROPONINI in the last 168 hours. BNP (last 3 results) No results for input(s): PROBNP in the last 8760 hours. HbA1C: No results for input(s): HGBA1C in the last 72 hours. CBG: No results for input(s): GLUCAP in the last 168 hours. Lipid Profile: No results for input(s): CHOL, HDL, LDLCALC, TRIG, CHOLHDL, LDLDIRECT in the last 72 hours. Thyroid Function Tests: No results for input(s): TSH, T4TOTAL, FREET4, T3FREE, THYROIDAB in the last 72 hours. Anemia Panel: No results for input(s): VITAMINB12, FOLATE, FERRITIN, TIBC, IRON, RETICCTPCT in the last 72 hours. Urine analysis:    Component Value Date/Time   COLORURINE STRAW (A) 03/23/2016 0430   APPEARANCEUR CLEAR 03/23/2016 0430   LABSPEC 1.012 03/23/2016 0430   PHURINE 6.0 03/23/2016 0430   GLUCOSEU NEGATIVE  03/23/2016 0430   HGBUR SMALL (A) 03/23/2016 0430   BILIRUBINUR NEGATIVE 03/23/2016 0430   KETONESUR 5 (A) 03/23/2016 0430   PROTEINUR NEGATIVE 03/23/2016 0430   UROBILINOGEN 1.0 10/23/2014 1829   NITRITE NEGATIVE 03/23/2016 0430   LEUKOCYTESUR NEGATIVE 03/23/2016 0430   Sepsis Labs: '@LABRCNTIP' (procalcitonin:4,lacticidven:4)  ) Recent Results (from the past 240 hour(s))  Urine culture     Status: None   Collection Time: 03/23/16  4:30 AM  Result Value Ref Range Status   Specimen Description URINE, CATHETERIZED  Final   Special Requests NONE  Final   Culture NO GROWTH  Final   Report Status 03/24/2016 FINAL  Final  MRSA PCR Screening     Status: None   Collection Time: 03/23/16 12:08 PM  Result Value Ref Range Status   MRSA by PCR NEGATIVE NEGATIVE Final    Comment:        The GeneXpert MRSA Assay (FDA approved for NASAL specimens only), is one component of a comprehensive MRSA colonization surveillance program. It is not intended to  diagnose MRSA infection nor to guide or monitor treatment for MRSA infections.   Respiratory Panel by PCR     Status: Abnormal   Collection Time: 03/23/16 12:08 PM  Result Value Ref Range Status   Adenovirus NOT DETECTED NOT DETECTED Final   Coronavirus 229E NOT DETECTED NOT DETECTED Final   Coronavirus HKU1 NOT DETECTED NOT DETECTED Final   Coronavirus NL63 NOT DETECTED NOT DETECTED Final   Coronavirus OC43 NOT DETECTED NOT DETECTED Final   Metapneumovirus NOT DETECTED NOT DETECTED Final   Rhinovirus / Enterovirus NOT DETECTED NOT DETECTED Final   Influenza A NOT DETECTED NOT DETECTED Final   Influenza B NOT DETECTED NOT DETECTED Final   Parainfluenza Virus 1 NOT DETECTED NOT DETECTED Final   Parainfluenza Virus 2 NOT DETECTED NOT DETECTED Final   Parainfluenza Virus 3 NOT DETECTED NOT DETECTED Final   Parainfluenza Virus 4 NOT DETECTED NOT DETECTED Final   Respiratory Syncytial Virus DETECTED (A) NOT DETECTED Final    Comment: CRITICAL RESULT CALLED TO, READ BACK BY AND VERIFIED WITH: L. [place] RN, AT 6226 03/23/16 BY D. VANHOOK    Bordetella pertussis NOT DETECTED NOT DETECTED Final   Chlamydophila pneumoniae NOT DETECTED NOT DETECTED Final   Mycoplasma pneumoniae NOT DETECTED NOT DETECTED Final      Radiology Studies: Dg Chest 2 View  Result Date: 03/24/2016 CLINICAL DATA:  Coughing 1 week. EXAM: CHEST  2 VIEW COMPARISON:  03/23/2016 and 08/06/2015 FINDINGS: Lungs are adequately inflated with linear atelectasis over the right mid to lower lung. Likely minimal left base atelectasis. Small amount of pleural fluid seen posteriorly on the lateral film. Cardiomediastinal silhouette is within normal. There is minimal calcified plaque over the aortic arch. Stable compression fracture over the lower thoracic spine. IMPRESSION: Linear atelectasis over the right mid to lower lung and minimal left base opacification likely atelectasis. Small amount of bilateral pleural fluid  posteriorly. Stable lower thoracic spine compression fracture. Aortic atherosclerosis. Electronically Signed   By: Marin Olp M.D.   On: 03/24/2016 08:50   Dg Chest Port 1 View  Result Date: 03/23/2016 CLINICAL DATA:  Sepsis and fever EXAM: PORTABLE CHEST 1 VIEW COMPARISON:  08/06/2015 FINDINGS: Shallow inspiration. No confluent airspace consolidation. No effusions. Mild atelectatic lung base opacities, accentuated by the shallow inspiration. Hilar, mediastinal and cardiac contours are unremarkable and unchanged. Pulmonary vasculature is normal. IMPRESSION: No acute findings. Shallow inspiration with mild atelectatic appearing linear lung base  opacities. Electronically Signed   By: Andreas Newport M.D.   On: 03/23/2016 02:08   Dg Abd Portable 2 Views  Result Date: 03/23/2016 CLINICAL DATA:  Sepsis and fever EXAM: PORTABLE ABDOMEN - 2 VIEW COMPARISON:  08/06/2015 CT FINDINGS: Generous volume of air throughout small and large bowel. No radiographic evidence of bowel obstruction or perforation. Generous volume colonic stool. No biliary or urinary calculi are evident. IMPRESSION: Generous volume air throughout small and large bowel without evidence of obstruction or perforation. Electronically Signed   By: Andreas Newport M.D.   On: 03/23/2016 02:09     Scheduled Meds: . atenolol  12.5 mg Oral Daily  . docusate sodium  100 mg Oral BID  . guaiFENesin  600 mg Oral BID  . ipratropium  0.5 mg Nebulization BID  . levalbuterol  0.63 mg Nebulization BID  . memantine  5 mg Oral Daily  . multivitamin with minerals  1 tablet Oral QHS  . piperacillin-tazobactam (ZOSYN)  IV  3.375 g Intravenous Q8H  . polyethylene glycol  17 g Oral QHS  . rivaroxaban  15 mg Oral Q supper  . vancomycin  1,250 mg Intravenous Q24H   Continuous Infusions:   LOS: 1 day   Time Spent in minutes   30 minutes  Markiya Keefe D.O. on 03/24/2016 at 10:51 AM  Between 7am to 7pm - Pager - [phone]  After 7pm go  to [email or web address] - password [email or web address]  And look for the night coverage person covering for me after hours  [facility] Office  [phone]

## 2016-03-24 NOTE — Progress Notes (Signed)
Pharmacy Antibiotic Note  Craig Bennett is a 82 y.o. male admitted on 03/23/2016 with sepsis.  Pharmacy has been consulted for Vancomycin/Zosyn dosing. From nursing facility with altered mental status. Pt is afebrile and WBC WNL. SCr has improved from 1.25 to 1 and CrCl ~ 55-60 mL/min. Due to improvement in renal function, will dose adjust vancomycin.   Plan: Change Vancomycin to 750 mg IV q12h Zosyn 3.375g IV q8h to be infused over 4 hours Trend WBC, temp, renal function  F/U infectious work-up Drug levels as indicated  Temp (24hrs), Avg:98.1 F (36.7 C), Min:97.7 F (36.5 C), Max:98.3 F (36.8 C)   Recent Labs Lab 03/23/16 0116 03/23/16 0215 03/23/16 0402 03/24/16 0345  WBC 6.4  --   --  8.5  CREATININE 1.25*  --   --  0.98  LATICACIDVEN  --  2.29* 1.74  --     Estimated Creatinine Clearance: 61.6 mL/min (by C-G formula based on SCr of 0.98 mg/dL).    No Known Allergies  Antimicrobials this admission:  12/24 Vanc >> 12/24 Zosyn >>   Dose adjustments this admission:  12/25: vancomycin 1250 mg q24hr >> vancomycin 750mg  q12hr for improvement in renal function.   Microbiology results: MRSA PCR 12/24 - neg Resp panel 12/24 - pos RSV  Urine Cx 12/24 - sent  Blood Cx x2 12/24 - sent (GPR 1/2 prelim)  York CeriseKatherine Cook, PharmD Pharmacy Resident  Pager [phone] 03/24/16 1:29 PM

## 2016-03-24 NOTE — Progress Notes (Addendum)
PHARMACY - PHYSICIAN COMMUNICATION CRITICAL VALUE ALERT - BLOOD CULTURE IDENTIFICATION (BCID)  Results for orders placed or performed during the hospital encounter of 03/23/16  Blood Culture ID Panel (Reflexed) (Collected: 03/23/2016  1:16 AM)  Result Value Ref Range   Enterococcus species NOT DETECTED NOT DETECTED   Listeria monocytogenes NOT DETECTED NOT DETECTED   Staphylococcus species NOT DETECTED NOT DETECTED   Staphylococcus aureus NOT DETECTED NOT DETECTED   Streptococcus species NOT DETECTED NOT DETECTED   Streptococcus agalactiae NOT DETECTED NOT DETECTED   Streptococcus pneumoniae NOT DETECTED NOT DETECTED   Streptococcus pyogenes NOT DETECTED NOT DETECTED   Acinetobacter baumannii NOT DETECTED NOT DETECTED   Enterobacteriaceae species NOT DETECTED NOT DETECTED   Enterobacter cloacae complex NOT DETECTED NOT DETECTED   Escherichia coli NOT DETECTED NOT DETECTED   Klebsiella oxytoca NOT DETECTED NOT DETECTED   Klebsiella pneumoniae NOT DETECTED NOT DETECTED   Proteus species NOT DETECTED NOT DETECTED   Serratia marcescens NOT DETECTED NOT DETECTED   Haemophilus influenzae NOT DETECTED NOT DETECTED   Neisseria meningitidis NOT DETECTED NOT DETECTED   Pseudomonas aeruginosa NOT DETECTED NOT DETECTED   Candida albicans NOT DETECTED NOT DETECTED   Candida glabrata NOT DETECTED NOT DETECTED   Candida krusei NOT DETECTED NOT DETECTED   Candida parapsilosis NOT DETECTED NOT DETECTED   Candida tropicalis NOT DETECTED NOT DETECTED   Provider Notified: Dr. Edsel PetrinMaryann Mikhail  Changes to prescribed antibiotics required: 1/2 gram pos rods previously reported; BCID showed no organisms. Afeb, WBC wnl. Will transition to levofloxacin per Dr. Catha GosselinMikhail.   Plan: Levofloxacin po 750mg  q24h F/u renal function, length of therapy  [place] PinkGazda, [employer] P 03/24/2016  1:56 PM

## 2016-03-25 DIAGNOSIS — K5900 Constipation, unspecified: Secondary | ICD-10-CM

## 2016-03-25 DIAGNOSIS — F0390 Unspecified dementia without behavioral disturbance: Secondary | ICD-10-CM

## 2016-03-25 LAB — CBC
HCT: 42.2 % (ref 39.0–52.0)
Hemoglobin: 13.7 g/dL (ref 13.0–17.0)
MCH: 29.7 pg (ref 26.0–34.0)
MCHC: 32.5 g/dL (ref 30.0–36.0)
MCV: 91.3 fL (ref 78.0–100.0)
PLATELETS: 162 10*3/uL (ref 150–400)
RBC: 4.62 MIL/uL (ref 4.22–5.81)
RDW: 14.5 % (ref 11.5–15.5)
WBC: 7.7 10*3/uL (ref 4.0–10.5)

## 2016-03-25 LAB — BASIC METABOLIC PANEL
ANION GAP: 9 (ref 5–15)
BUN: 13 mg/dL (ref 6–20)
CALCIUM: 9.7 mg/dL (ref 8.9–10.3)
CO2: 25 mmol/L (ref 22–32)
Chloride: 102 mmol/L (ref 101–111)
Creatinine, Ser: 1.05 mg/dL (ref 0.61–1.24)
Glucose, Bld: 82 mg/dL (ref 65–99)
Potassium: 3.6 mmol/L (ref 3.5–5.1)
Sodium: 136 mmol/L (ref 135–145)

## 2016-03-25 LAB — PROCALCITONIN: PROCALCITONIN: 0.27 ng/mL

## 2016-03-25 MED ORDER — RIVAROXABAN 20 MG PO TABS: 20.0000 mg | ORAL_TABLET | Freq: Every day | ORAL | Status: DC

## 2016-03-25 MED ORDER — RIVAROXABAN 20 MG PO TABS: 20.0000 mg | ORAL_TABLET | Freq: Every day | ORAL | 0 refills | Status: AC

## 2016-03-25 MED ORDER — LEVOFLOXACIN 750 MG PO TABS: 750.0000 mg | ORAL_TABLET | Freq: Every day | ORAL | 0 refills | Status: DC

## 2016-03-25 NOTE — NC FL2 (Signed)
[place] MEDICAID FL2 LEVEL OF CARE SCREENING TOOL     IDENTIFICATION  Patient Name: Craig Bennett Birthdate: 07/06/1929 Sex: male Admission Date (Current Location): 03/23/2016  [facility]   Facility and Address:  [facility], [address], [place], [place] [address]      Provider Number: [id number]  Attending Physician Name and Address:  Edsel PetrinMaryann Mikhail, DO  Relative Name and Phone Number:       Current Level of Care: Hospital Recommended Level of Care: Skilled Nursing Facility Prior Approval Number:    Date Approved/Denied:   PASRR Number: [id number]  Discharge Plan: SNF    Current Diagnoses: Patient Active Problem List   Diagnosis Date Noted  . Sepsis (HCC) 03/23/2016  . Acute metabolic encephalopathy 03/23/2016  . Cough 03/23/2016  . Chronic diastolic heart failure, NYHA class 1 (HCC) 03/23/2016  . Constipation 03/23/2016  . History of pulmonary embolism 08/06/2015  . AKI (acute kidney injury) (HCC) 10/24/2014  . Syncope and collapse   . Syncope 10/22/2014  . Dementia 10/22/2014  . Chronic atrial fibrillation (HCC) 10/22/2014  . Orthostatic hypotension 10/22/2014  . Symptomatic bradycardia 10/22/2014  . Low testosterone 10/22/2014  . CKD (chronic kidney disease), stage III 10/22/2014  . H/O prostate cancer 10/22/2014    Orientation RESPIRATION BLADDER Height & Weight     Self, Time, Situation, Place  Normal Continent Weight: 179 lb 14.4 oz (81.6 kg) Height:  6' (182.9 cm)  BEHAVIORAL SYMPTOMS/MOOD NEUROLOGICAL BOWEL NUTRITION STATUS      Continent Diet (Heart Healthy, Thin Liquids)  AMBULATORY STATUS COMMUNICATION OF NEEDS Skin   Extensive Assist Verbally Normal                       Personal Care Assistance Level of Assistance  Bathing, Feeding, Dressing Bathing Assistance: Limited assistance Feeding assistance: Independent Dressing Assistance: Limited assistance     Functional  Limitations Info  Sight, Hearing, Speech Sight Info: Adequate Hearing Info: Adequate Speech Info: Adequate    SPECIAL CARE FACTORS FREQUENCY                       Contractures Contractures Info: Not present    Additional Factors Info  Code Status, Allergies Code Status Info: Full Code Allergies Info: No known allergies           Current Medications (03/25/2016):  This is the current hospital active medication list Current Facility-Administered Medications  Medication Dose Route Frequency Provider Last Rate Last Dose  . atenolol (TENORMIN) tablet 12.5 mg  12.5 mg Oral Daily Russella DarAllison L Ellis, NP   12.5 mg at 03/25/16 0944  . bisacodyl (DULCOLAX) suppository 10 mg  10 mg Rectal Daily PRN Russella DarAllison L Ellis, NP      . docusate sodium (COLACE) capsule 100 mg  100 mg Oral BID Russella DarAllison L Ellis, NP   100 mg at 03/25/16 0945  . guaiFENesin (MUCINEX) 12 hr tablet 600 mg  600 mg Oral BID Russella DarAllison L Ellis, NP   600 mg at 03/25/16 0943  . ipratropium (ATROVENT) nebulizer solution 0.5 mg  0.5 mg Nebulization BID Maryann Mikhail, DO   0.5 mg at 03/25/16 0724  . levalbuterol (XOPENEX) nebulizer solution 0.63 mg  0.63 mg Nebulization BID Maryann Mikhail, DO   0.63 mg at 03/25/16 [id number]  . levalbuterol (XOPENEX) nebulizer solution 1.25 mg  1.25 mg Nebulization Q6H PRN Haydee SalterPhillip M Hobbs, MD      .  levofloxacin (LEVAQUIN) tablet 750 mg  750 mg Oral Daily [place] Pinkicholas P Gazda, RPH   750 mg at 03/25/16 0945  . memantine (NAMENDA) tablet 5 mg  5 mg Oral Daily Russella DarAllison L Ellis, NP   5 mg at 03/25/16 0945  . multivitamin with minerals tablet 1 tablet  1 tablet Oral QHS Russella DarAllison L Ellis, NP   1 tablet at 03/24/16 2119  . polyethylene glycol (MIRALAX / GLYCOLAX) packet 17 g  17 g Oral QHS Russella DarAllison L Ellis, NP   17 g at 03/24/16 2119  . rivaroxaban (XARELTO) tablet 20 mg  20 mg Oral Q supper Edsel PetrinMaryann Mikhail, DO         Discharge Medications: Please see discharge summary for a list of discharge  medications.  Relevant Imaging Results:  Relevant Lab Results:   Additional Information SSN:  [id number]  Dede QuerySarah Khyli Swaim, LCSW

## 2016-03-25 NOTE — Care Management Note (Signed)
Case Management Note Donn PieriniKristi Ardelia Wrede RN, BSN Unit 2W-Case Manager [phone] Covering 3W  Patient Details  Name: Craig MethJames D Bernabe MRN: [id number] Date of Birth: 07/10/1929  Subjective/Objective:  Pt admitted with sepsis                   Action/Plan: [facility]- CSW following for return to SNF.   Expected Discharge Date:     03/25/16             Expected Discharge Plan:  Skilled Nursing Facility  In-House Referral:  Clinical Social Work  Discharge planning Services     Post Acute Care Choice:    Choice offered to:     DME Arranged:    DME Agency:     HH Arranged:    HH Agency:     Status of Service:  Completed, signed off  If discussed at [employer] [employer] of [employer] Meetings, dates discussed:    Additional Comments:  Darrold SpanWebster, Aviendha Azbell Hall, RN 03/25/2016, 11:18 AM

## 2016-03-25 NOTE — Progress Notes (Addendum)
Droplet precaution d/c per Tammy from the infectious disease department . She stated that there is no need for precaution in case of RSV in adults.  Colleen Canesar Sandra Brents, RN

## 2016-03-25 NOTE — Progress Notes (Signed)
[facility]. Report given to admitting nurse. All questions were answered.  Colleen Canesar Kayline Sheer, RN

## 2016-03-25 NOTE — Discharge Instructions (Signed)
Respiratory Syncytial Virus, Adult °Respiratory syncytial virus (RSV) is a common viral infection. It is caused by a virus that is similar to viruses that cause cold and flu symptoms. RSV can affect the nose, throat, and upper air passages (upper respiratory system) and the windpipe and lungs (lower respiratory system). If RSV affects the air passages in your lungs, you have bronchiolitis. If RSV affects your lungs, you have pneumonia. °RSV spreads from person to person (is contagious) through droplets from coughs and sneezes (respiratory secretions). RSV is rarely serious when it occurs in adults. °What are the causes? °An RSV infection is caused by the respiratory syncytial virus. When a sick person coughs or sneezes, there are particles of the virus in the droplets. You can get sick if you breathe in (inhale) those droplets. The virus can also survive for a while in droplets that land on surfaces. If you touch a contaminated surface and then touch your face, the virus can enter your body through your mouth, nose, or eyes. The virus also spreads through kissing, close contact, and shared eating or drinking utensils. °What increases the risk? °You may have a higher risk for RSV infection if: °· You are 65 or older. °· You have a long-term (chronic) lung condition, such as COPD. °· You have a weakened disease-fighting system (immune system). °· You have Down syndrome. °· You have heart disease. °· You work in a hospital or other health care facility. °· You live in a long-term health care facility. °What are the signs or symptoms? °Symptoms of RSV include: °· Runny nose. °· Coughing. You may have a cough that brings up mucus (productive cough). °· Sneezing. °· Fever. °· Decreased appetite. °· Breathing loudly (wheezing). °· Shortness of breath. °· Fluid buildup in the lungs (respiratory distress). °How is this diagnosed? °This condition may be diagnosed based on: °· Your symptoms. °· Your medical history. °· A  physical exam. °· A chest X-ray to rule out pneumonia. °· Blood tests or tests of mucus from your lungs (sputum). These tests may be done if you are older. °· A test of your respiratory secretions. °How is this treated? °In most cases, the RSV infection will go away after 1-2 weeks of caring for yourself at home. If you have severe RSV and you develop pneumonia, you may need to be treated in the hospital with oxygen, antibiotic medicine, and medicines to open your breathing tubes (bronchodilators). °Follow these instructions at home: °· Take over-the-counter and prescription medicines only as told by your health care provider. °· Drink enough fluid to keep your urine clear or pale yellow. °· Rest at home until your symptoms go away. °· Eat a healthy diet. °· Do not use any products that contain nicotine or tobacco, such as cigarettes and e-cigarettes. If you need help quitting, ask your health care provider. °· Keep all follow-up visits as told by your health care provider. This is important. °How is this prevented? °To prevent catching and spreading the RSV virus: °· Wash your hands often with soap and water. If soap and water are not available, use an alcohol-based hand sanitizer. If you have not cleaned your hands, do not touch your face. °· If you have cold-like or flu-like symptoms, stay home. °· Cover your nose and mouth when you cough or sneeze. °· Avoid large groups of people. °· Keep a safe distance from people who are coughing and sneezing. °Contact a health care provider if: °· Your symptoms get worse. °· Your   symptoms have not improved after 2 weeks. °· You have a fever. °· You have continuing (persistent) sweatiness, hot flashes, or chills. °· You cough up much more mucus than usual. °· You cough up blood. °· You feel very tired (are lethargic). °· You become confused. °· You have respiratory distress that gets worse. °This information is not intended to replace advice given to you by your health care  provider. Make sure you discuss any questions you have with your health care provider. °Document Released: 08/28/2015 Document Revised: 10/05/2015 Document Reviewed: 08/28/2015 °Elsevier Interactive Patient Education © 2017 [employer]vier Inc. ° °

## 2016-03-25 NOTE — Clinical Social Work Note (Signed)
Clinical Social Work Assessment  Patient Details  Name: Craig Bennett MRN: [id number] Date of Birth: November 09, 1929  Date of referral:  03/25/16               Reason for consult:  Facility Placement, Discharge Planning                Permission sought to share information with:  Other (Friend) Permission granted to share information::  Yes, Verbal Permission Granted  Name::     [name] Plyler  Relationship::  friend  Contact Information:  [phone]  Housing/Transportation Living arrangements for the past 2 months:  [place] of Information:  Patient Patient Interpreter Needed:  None Criminal Activity/Legal Involvement Pertinent to Current Situation/Hospitalization:  No - Comment as needed Significant Relationships:  Friend, Neighbor Lives with:  Facility Resident Do you feel safe going back to the place where you live?  Yes Need for family participation in patient care:  No (Coment)  Care giving concerns:  No care giving needs identified.   Social Worker assessment / plan:  CSW met with pt to address consult as pt was admitted from [facility]. CSW introduced herself and explained role of social work. CSW also explained process of returning to SNF. Pt is agreeable to returning. Facility is ready to admit pt as they have received discharge information. RN to call report. [facility] will provide transportation. CSW attempted to reach pt's friend, [name], however the line was busy. CSW updated pt. CSW is signing off as no further needs identified.   Employment status:  Retired Nurse, adult PT Recommendations:  Not assessed at this time Information / Referral to community resources:  Other (Comment Required) ([facility])  Patient/Family's Response to care:  Pt was appreciative of CSW support.   Patient/Family's Understanding of and Emotional Response to Diagnosis, Current Treatment, and Prognosis:  Pt understands that plan is to return to facility.    Emotional Assessment Appearance:  Appears stated age Attitude/Demeanor/Rapport:  Other (Appropriate) Affect (typically observed):  Accepting, Adaptable, Pleasant Orientation:  Oriented to Self, Oriented to Place, Oriented to  Time, Oriented to Situation Alcohol / Substance use:  Not Applicable Psych involvement (Current and /or in the community):  No (Comment)  Discharge Needs  Concerns to be addressed:  Other (Comment Required (discharge planning needs) Readmission within the last 30 days:  No Current discharge risk:  Chronically ill Barriers to Discharge:  No Barriers Identified   Darden Dates, LCSW 03/25/2016, 3:37 PM

## 2016-03-25 NOTE — Discharge Summary (Addendum)
Physician Discharge Summary  Craig Bennett ALP:[id number] DOB: [birth year] DOA: 03/23/2016  PCP: Thressa Sheller, MD  Admit date: 03/23/2016 Discharge date: 03/25/2016  Time spent: 45 minutes  Recommendations for Outpatient Follow-up:  Patient will be discharged to skilled nursing facility.  Patient will need to follow up with primary care provider within one week of discharge.  Patient should continue medications as prescribed.  Patient should follow a heart healthy diet.   Discharge Diagnoses:  Sepsis secondary to URI/Possible HCAP AKI (acute kidney injury) /CKD (chronic kidney disease), stage III Chronic atrial fibrillation  Acute metabolic encephalopathy Chronic diastolic heart failure, NYHA class 1  Dementia H/O prostate cancer History of pulmonary embolism Constipation  Discharge Condition: Stable  Diet recommendation: heart healthy  Filed Weights   03/23/16 1300 03/24/16 0300 03/25/16 0400  Weight: 81.5 kg (179 lb 9.6 oz) 81.9 kg (180 lb 8 oz) 81.6 kg (179 lb 14.4 oz)    History of present illness:  on 03/23/2016 by Ms. Craig Hearing, NP Nelda Severe Deereis a 82 y.o.malewith medical history significant for dementia, chronic diastolic heart failure not on diuretics, history of saddle pulmonary embolus May 2017 on anticoagulation, chronic atrial fibrillation on anticoagulation, stage III chronic kidney disease and history of prostate cancer. Documentation is limited to information obtained by the EDP and patient has history of dementia so inconsistent historian. The patient apparently has had upper respiratory infection symptoms and it was documented that the patient was supposed to start Rocephin. He developed fever greater than 102 with altered mentation and worsening cough and was sent to the ER for evaluation. Patient reports that he knew he was feeling poorly but was not specific otherwise regarding history of current symptoms. There is no documentation as to whether the  patient was hypoxemic at the facility or pulmonary arrival to the ER and he is currently on nasal cannula oxygen. At presentation his serum lactate was greater than 2.0and he had mild acute kidney injury and EDP felt patient met sepsis criteria so sepsis protocol was initiated in the ER. After fluid challenges and appropriate fluid resuscitation lactate has normalized and patient's vital signs are stable.  Hospital Course:  Sepsis secondary to URI/Possible HCAP -Patient presents with mild sepsis physiology that has resolved with appropriate treatment in the ER consisting of volume resuscitation, oxygen therapy and antibiotics -Sepsis order set initiated in the ER -Source appears to be respiratory -CXR right mid-lower lobe opacification likely atelectasis- thought to be HCAP on admission. Covered with antibiotics.  -Respiratory viral panel +RSV -Influenza and urine strep/legionella antigens negative -Continue supportive care -Started on vanc/zosyn empirically, transitioned to levaquin -Blood cultures 1/2 GPR, however, ID reflexed panel showed no organisms detected. Likely contaminent. -Continue mcuinex  AKI (acute kidney injury) /CKD (chronic kidney disease), stage III -Resolved, Secondary to sepsis -Baseline Creatinine 0.91, was 1.25 upon admission -Currently 1.0 -Continue to monitor BMP  Chronic atrial fibrillation  -Stable without RVR -Continue Xarelto -chadvasc=4  Acute metabolic encephalopathy -Secondary to sepsis -Continue to monitor   Chronic diastolic heart failure, NYHA class 1  -Currently compensated -Last echo 08/07/2015: EF 55% -Received gentle IVF due to sepsis -Monitor intake/output, daily weights  Dementia -Continue Namenda -Monitor for acute delirium and setting of acute infectious process and change in surroundings  H/O prostate cancer -No documentation of urinary difficulties  History of pulmonary embolism -Continue  Xarelto  Constipation -Continue bowel regimen  Consultants None  Procedures  none  Discharge Exam: Vitals:   03/25/16 0944 03/25/16 1323  BP: Marland Kitchen)  158/86 137/90  Pulse: (!) 106 83  Resp:  17  Temp:  98.7 F (37.1 C)   Feels breathing has improved, but still has occ cough.  Denies chest pain, abdominal pain, N/V/D.  Exam  General: Well developed, well nourished, NAD, appears stated age  HEENT: NCAT, mucous membranes moist.   Cardiovascular: S1 S2 auscultated, no rubs, murmurs or gallops. IRR  Respiratory: Diminished breath sounds, +Exp wheezing, +cough  Abdomen: Soft, nontender, nondistended, + bowel sounds  Extremities: warm dry without cyanosis clubbing or edema  Neuro: Awake and alert, oriented x 3. nonfocal  Psych: Approprirate  Discharge Instructions Discharge Instructions    Discharge instructions    Complete by:  As directed    Patient will be discharged to skilled nursing facility.  Patient will need to follow up with primary care provider within one week of discharge.  Patient should continue medications as prescribed.  Patient should follow a heart healthy diet.     Current Discharge Medication List    START taking these medications   Details  levofloxacin (LEVAQUIN) 750 MG tablet Take 1 tablet (750 mg total) by mouth daily. Qty: 3 tablet, Refills: 0      CONTINUE these medications which have CHANGED   Details  rivaroxaban (XARELTO) 20 MG TABS tablet Take 1 tablet (20 mg total) by mouth daily with supper. Qty: 30 tablet, Refills: 0      CONTINUE these medications which have NOT CHANGED   Details  acetaminophen (TYLENOL) 325 MG tablet Take 2 tablets (650 mg total) by mouth every 6 (six) hours as needed for mild pain (or Fever >/= 101). Qty: 30 tablet, Refills: 0    atenolol (TENORMIN) 25 MG tablet Take 0.5 tablets (12.5 mg total) by mouth daily. Qty: 30 tablet, Refills: 0    guaiFENesin (MUCINEX) 600 MG 12 hr tablet Take 600 mg by mouth 2  (two) times daily.    memantine (NAMENDA) 5 MG tablet Take 1 tablet (5 mg total) by mouth daily. Qty: 30 tablet, Refills: 0    therapeutic multivitamin-minerals (THERAGRAN-M) tablet Take 1 tablet by mouth at bedtime.       No Known Allergies Follow-up Information    [name],[name], MD. Schedule an appointment as soon as possible for a visit in 1 week(s).   Specialty:  Internal Medicine Why:  Hospital follow up Contact information: [address] [place] [address] [phone]            The results of significant diagnostics from this hospitalization (including imaging, microbiology, ancillary and laboratory) are listed below for reference.    Significant Diagnostic Studies: Dg Chest 2 View  Result Date: 03/24/2016 CLINICAL DATA:  Coughing 1 week. EXAM: CHEST  2 VIEW COMPARISON:  03/23/2016 and 08/06/2015 FINDINGS: Lungs are adequately inflated with linear atelectasis over the right mid to lower lung. Likely minimal left base atelectasis. Small amount of pleural fluid seen posteriorly on the lateral film. Cardiomediastinal silhouette is within normal. There is minimal calcified plaque over the aortic arch. Stable compression fracture over the lower thoracic spine. IMPRESSION: Linear atelectasis over the right mid to lower lung and minimal left base opacification likely atelectasis. Small amount of bilateral pleural fluid posteriorly. Stable lower thoracic spine compression fracture. Aortic atherosclerosis. Electronically Signed   By: Marin Olp M.D.   On: 03/24/2016 08:50   Dg Chest Port 1 View  Result Date: 03/23/2016 CLINICAL DATA:  Sepsis and fever EXAM: PORTABLE CHEST 1 VIEW COMPARISON:  08/06/2015 FINDINGS: Shallow  inspiration. No confluent airspace consolidation. No effusions. Mild atelectatic lung base opacities, accentuated by the shallow inspiration. Hilar, mediastinal and cardiac contours are unremarkable and unchanged. Pulmonary vasculature is  normal. IMPRESSION: No acute findings. Shallow inspiration with mild atelectatic appearing linear lung base opacities. Electronically Signed   By: Andreas Newport M.D.   On: 03/23/2016 02:08   Dg Abd Portable 2 Views  Result Date: 03/23/2016 CLINICAL DATA:  Sepsis and fever EXAM: PORTABLE ABDOMEN - 2 VIEW COMPARISON:  08/06/2015 CT FINDINGS: Generous volume of air throughout small and large bowel. No radiographic evidence of bowel obstruction or perforation. Generous volume colonic stool. No biliary or urinary calculi are evident. IMPRESSION: Generous volume air throughout small and large bowel without evidence of obstruction or perforation. Electronically Signed   By: Andreas Newport M.D.   On: 03/23/2016 02:09    Microbiology: Recent Results (from the past 240 hour(s))  Culture, blood (Routine x 2)     Status: Abnormal   Collection Time: 03/23/16  1:16 AM  Result Value Ref Range Status   Specimen Description BLOOD RIGHT ANTECUBITAL  Final   Special Requests BOTTLES DRAWN AEROBIC AND ANAEROBIC 5CC EA  Final   Culture  Setup Time   Final    GRAM POSITIVE RODS IN BOTH AEROBIC AND ANAEROBIC BOTTLES CRITICAL RESULT CALLED TO, READ BACK BY AND VERIFIED WITH: N GAZDA,PHARMD AT 1352 03/24/16 BY L BENFIELD    Culture (A)  Final    DIPHTHEROIDS(CORYNEBACTERIUM SPECIES) Standardized susceptibility testing for this organism is not available.    Report Status 03/25/2016 FINAL  Final  Blood Culture ID Panel (Reflexed)     Status: None   Collection Time: 03/23/16  1:16 AM  Result Value Ref Range Status   Enterococcus species NOT DETECTED NOT DETECTED Final   Listeria monocytogenes NOT DETECTED NOT DETECTED Final   Staphylococcus species NOT DETECTED NOT DETECTED Final   Staphylococcus aureus NOT DETECTED NOT DETECTED Final   Streptococcus species NOT DETECTED NOT DETECTED Final   Streptococcus agalactiae NOT DETECTED NOT DETECTED Final   Streptococcus pneumoniae NOT DETECTED NOT DETECTED  Final   Streptococcus pyogenes NOT DETECTED NOT DETECTED Final   Acinetobacter baumannii NOT DETECTED NOT DETECTED Final   Enterobacteriaceae species NOT DETECTED NOT DETECTED Final   Enterobacter cloacae complex NOT DETECTED NOT DETECTED Final   Escherichia coli NOT DETECTED NOT DETECTED Final   Klebsiella oxytoca NOT DETECTED NOT DETECTED Final   Klebsiella pneumoniae NOT DETECTED NOT DETECTED Final   Proteus species NOT DETECTED NOT DETECTED Final   Serratia marcescens NOT DETECTED NOT DETECTED Final   Haemophilus influenzae NOT DETECTED NOT DETECTED Final   Neisseria meningitidis NOT DETECTED NOT DETECTED Final   Pseudomonas aeruginosa NOT DETECTED NOT DETECTED Final   Candida albicans NOT DETECTED NOT DETECTED Final   Candida glabrata NOT DETECTED NOT DETECTED Final   Candida krusei NOT DETECTED NOT DETECTED Final   Candida parapsilosis NOT DETECTED NOT DETECTED Final   Candida tropicalis NOT DETECTED NOT DETECTED Final  Culture, blood (Routine x 2)     Status: None (Preliminary result)   Collection Time: 03/23/16  1:33 AM  Result Value Ref Range Status   Specimen Description BLOOD LEFT FOREARM  Final   Special Requests BOTTLES DRAWN AEROBIC AND ANAEROBIC 5CC EA  Final   Culture NO GROWTH 2 DAYS  Final   Report Status PENDING  Incomplete  Urine culture     Status: None   Collection Time: 03/23/16  4:30 AM  Result Value Ref Range Status   Specimen Description URINE, CATHETERIZED  Final   Special Requests NONE  Final   Culture NO GROWTH  Final   Report Status 03/24/2016 FINAL  Final  MRSA PCR Screening     Status: None   Collection Time: 03/23/16 12:08 PM  Result Value Ref Range Status   MRSA by PCR NEGATIVE NEGATIVE Final    Comment:        The GeneXpert MRSA Assay (FDA approved for NASAL specimens only), is one component of a comprehensive MRSA colonization surveillance program. It is not intended to diagnose MRSA infection nor to guide or monitor treatment for MRSA  infections.   Respiratory Panel by PCR     Status: Abnormal   Collection Time: 03/23/16 12:08 PM  Result Value Ref Range Status   Adenovirus NOT DETECTED NOT DETECTED Final   Coronavirus 229E NOT DETECTED NOT DETECTED Final   Coronavirus HKU1 NOT DETECTED NOT DETECTED Final   Coronavirus NL63 NOT DETECTED NOT DETECTED Final   Coronavirus OC43 NOT DETECTED NOT DETECTED Final   Metapneumovirus NOT DETECTED NOT DETECTED Final   Rhinovirus / Enterovirus NOT DETECTED NOT DETECTED Final   Influenza A NOT DETECTED NOT DETECTED Final   Influenza B NOT DETECTED NOT DETECTED Final   Parainfluenza Virus 1 NOT DETECTED NOT DETECTED Final   Parainfluenza Virus 2 NOT DETECTED NOT DETECTED Final   Parainfluenza Virus 3 NOT DETECTED NOT DETECTED Final   Parainfluenza Virus 4 NOT DETECTED NOT DETECTED Final   Respiratory Syncytial Virus DETECTED (A) NOT DETECTED Final    Comment: CRITICAL RESULT CALLED TO, READ BACK BY AND VERIFIED WITH: Lillia Dallas RN, AT 7116 03/23/16 BY D. VANHOOK    Bordetella pertussis NOT DETECTED NOT DETECTED Final   Chlamydophila pneumoniae NOT DETECTED NOT DETECTED Final   Mycoplasma pneumoniae NOT DETECTED NOT DETECTED Final     Labs: Basic Metabolic Panel:  Recent Labs Lab 03/23/16 0116 03/24/16 0345 03/25/16 0514  NA 134* 138 136  K 4.5 4.1 3.6  CL 104 106 102  CO2 '23 28 25  ' GLUCOSE 122* 116* 82  BUN '15 12 13  ' CREATININE 1.25* 0.98 1.05  CALCIUM 9.1 9.6 9.7   Liver Function Tests:  Recent Labs Lab 03/23/16 0116  AST 32  ALT 16*  ALKPHOS 68  BILITOT 0.8  PROT 6.4*  ALBUMIN 3.4*   No results for input(s): LIPASE, AMYLASE in the last 168 hours. No results for input(s): AMMONIA in the last 168 hours. CBC:  Recent Labs Lab 03/23/16 0116 03/24/16 0345 03/25/16 0514  WBC 6.4 8.5 7.7  NEUTROABS 5.3 7.0  --   HGB 13.7 12.8* 13.7  HCT 42.2 40.0 42.2  MCV 92.1 92.8 91.3  PLT 158 144* 162   Cardiac Enzymes: No results for input(s): CKTOTAL,  CKMB, CKMBINDEX, TROPONINI in the last 168 hours. BNP: BNP (last 3 results)  Recent Labs  08/06/15 1530  BNP 98.0    ProBNP (last 3 results) No results for input(s): PROBNP in the last 8760 hours.  CBG: No results for input(s): GLUCAP in the last 168 hours.     SignedCristal Ford  [facility] 03/25/2016, 2:09 PM

## 2016-03-27 LAB — CULTURE, BLOOD (ROUTINE X 2)

## 2016-03-28 LAB — CULTURE, BLOOD (ROUTINE X 2): CULTURE: NO GROWTH

## 2016-07-18 IMAGING — CT CT ABD-PELV W/ CM
1 of 12 series · 12 of 37 positions shown, 15 images · IV contrast (Iohexol (Omnipaque 350))
Comparison: None.

CLINICAL DATA: Shortness of breath while lying flat. Elevated
D-dimer.



[Series 506: thins pacs · axial · 0.75mm/px · z∈[+160,+390]mm · 12 of 272 slices shown, 15 images]
[im 21/272  mediastinal]
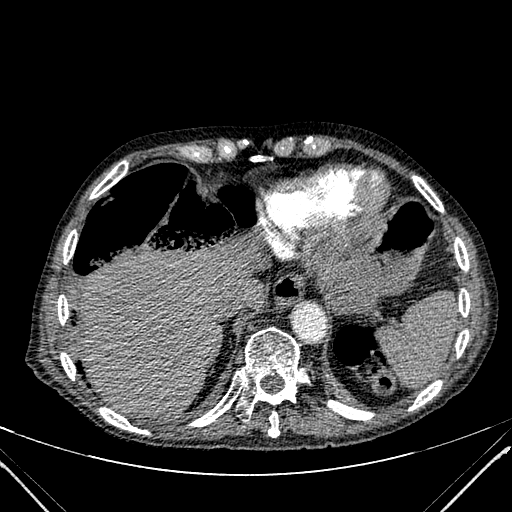
[im 21/272  lung]
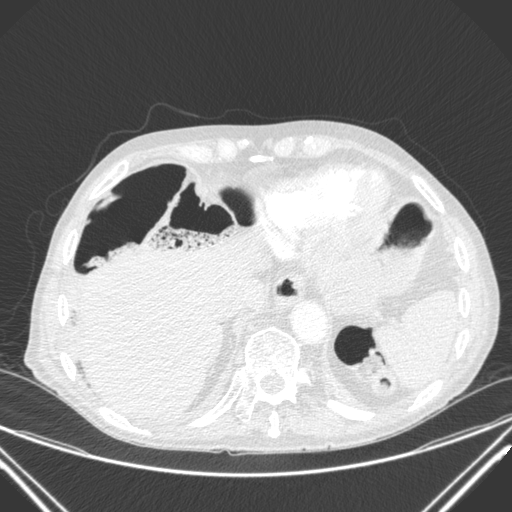
[im 42/272  lung]
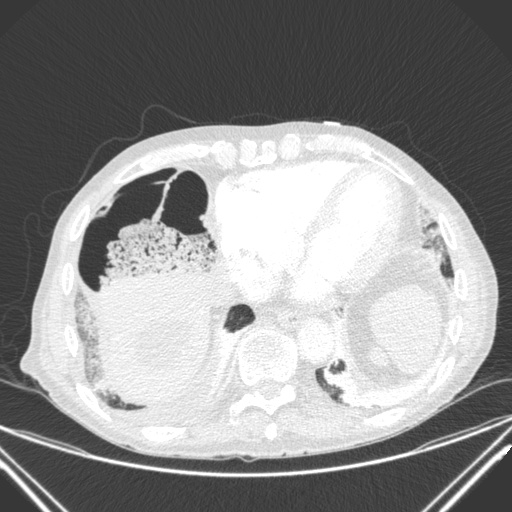
[im 63/272  lung]
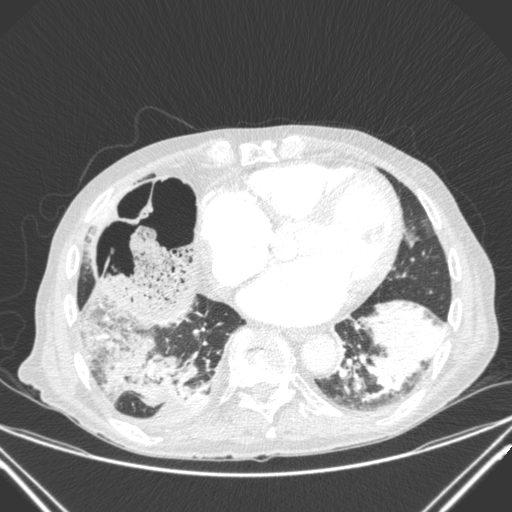
[im 84/272  lung]
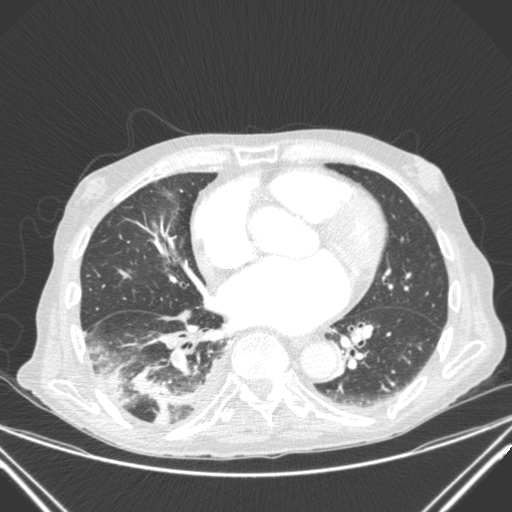
[im 105/272  mediastinal]
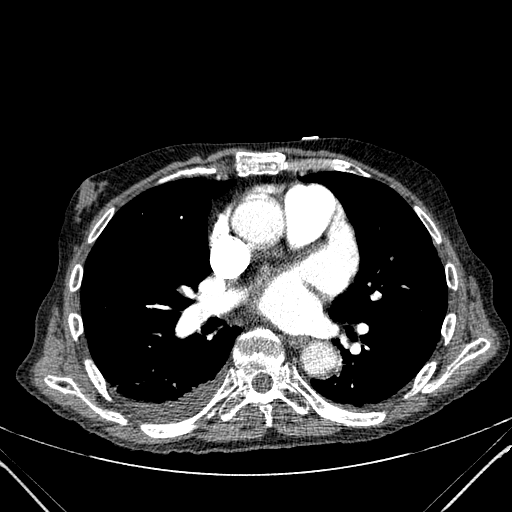
[im 105/272  lung]
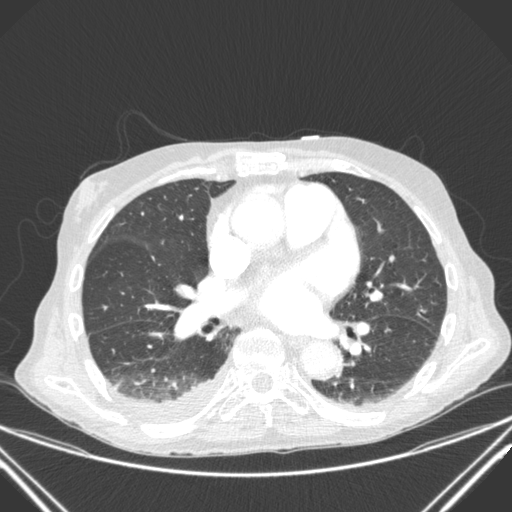
[im 126/272  lung]
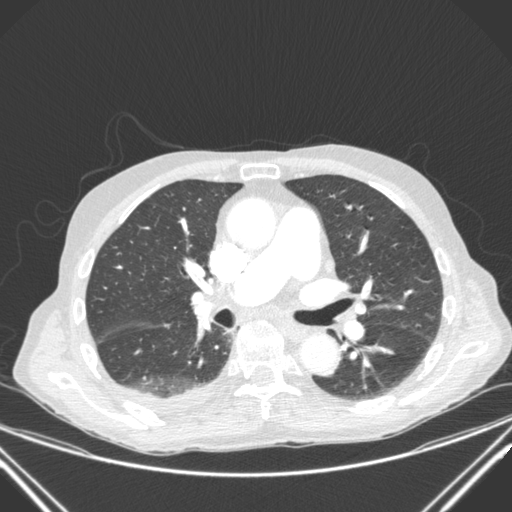
[im 146/272  lung]
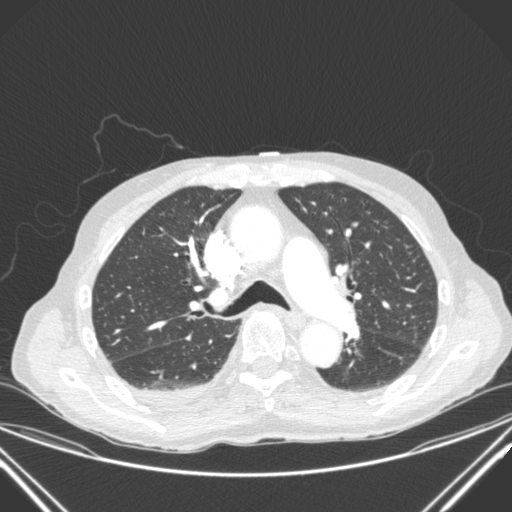
[im 167/272  lung]
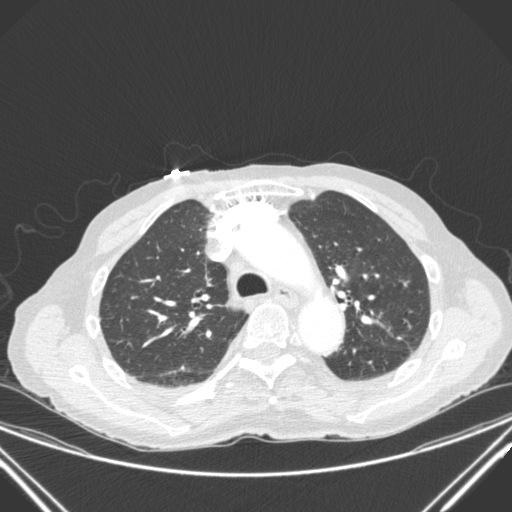
[im 188/272  mediastinal]
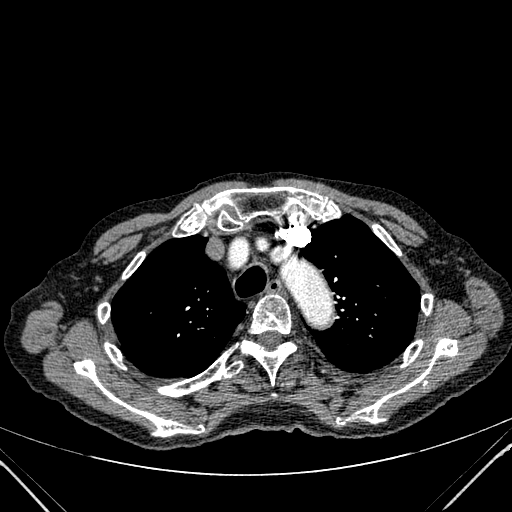
[im 188/272  lung]
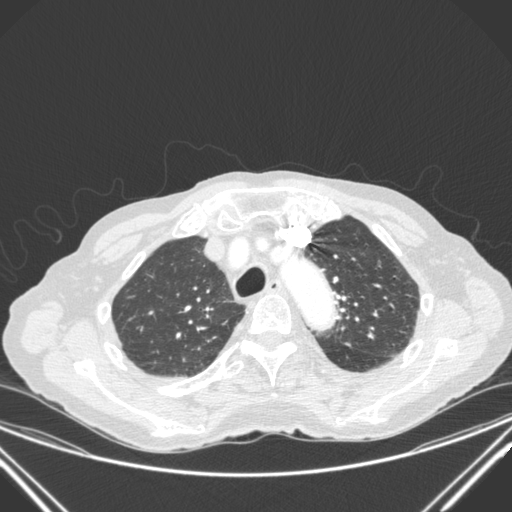
[im 209/272  lung]
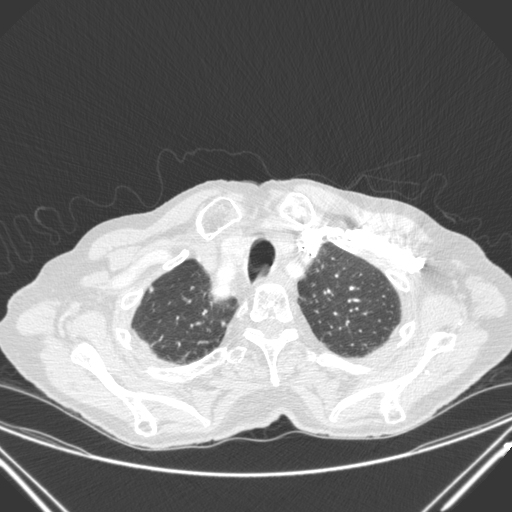
[im 230/272  lung]
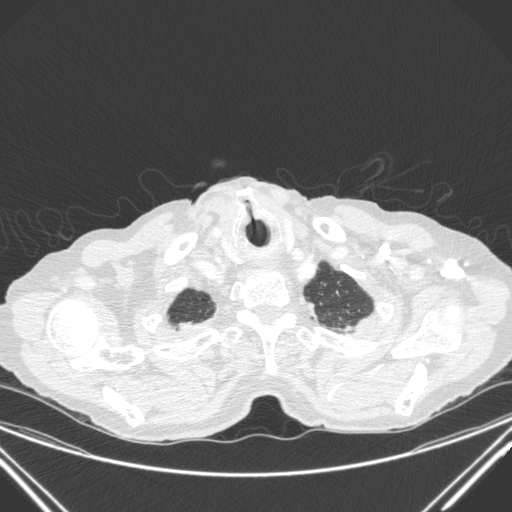
[im 251/272  lung]
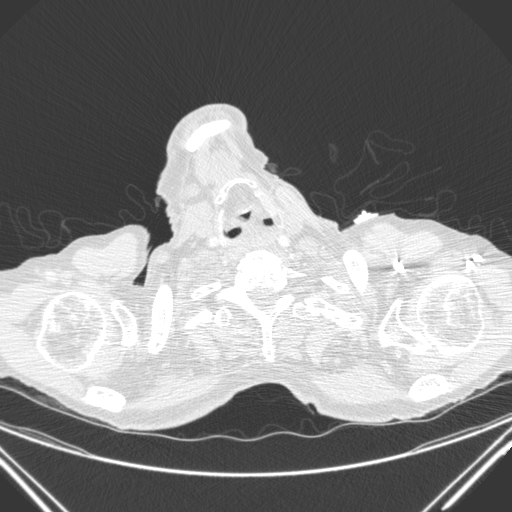

[12 of 37 positions shown; findings below may reference images not displayed]

FINDINGS: CTA CHEST FINDINGS

Mediastinum: There is a large saddle embolus involving the right and
left pulmonary arteries. There are also bilateral segmental
pulmonary artery filling defects as well as subsegmental filling
defects. The trachea appears patent and is midline. Normal
appearance of the esophagus. There is aortic atherosclerosis noted.
LAD coronary artery and left circumflex coronary artery filling
defects are identified. Prominent right hilar lymph node measures 9
mm.

Lungs/Pleura: Small right pleural effusion is identified.
Atelectasis and airspace consolidation within the lung bases noted
right greater than left compatible with pulmonary infarct. In the
right upper low there is a 4 x 2 mm nodule (3 mm mean diameter) on
image 26 of series 507.

Musculoskeletal: Mild spondylosis is noted within the thoracic
spine. No aggressive lytic or sclerotic bone lesions. Compression
fracture within the lower thoracic spine is identified at the T9
level. This is age indeterminate with loss of 50% of the vertebral
body height, image 100 of series 503. There is also a fracture
deformity involving the mid body of sternum. This appears chronic
and healed.

CT ABDOMEN and PELVIS FINDINGS

Hepatobiliary: Multiple fluid attenuating structures are identified
within the liver compatible with cysts. There is a enhancing lesion
within the posterior right lobe of liver which likely represents a
hemangioma. This measures 2.5 cm, image 29 of series 601. The
gallbladder is normal. No biliary dilatation.

Pancreas: Normal appearance of the pancreas.

Spleen: Negative

Adrenals/Urinary Tract: The adrenal glands are both normal.
Unremarkable appearance of both kidneys. Urinary bladder appears
normal.

Stomach/Bowel: The stomach is within normal limits. The small bowel
loops have a normal course and caliber. No obstruction. Normal
appearance of the colon. No pathologic dilatation of the small or
large bowel loops identified

Vascular/Lymphatic: Calcified atherosclerotic disease involves the
abdominal aorta. No aneurysm. No enlarged retroperitoneal or
mesenteric adenopathy. No enlarged pelvic or inguinal lymph nodes.

Reproductive: Seed implants are noted within the prostate gland.

Other: There is no ascites or focal fluid collections within the
abdomen or pelvis. Previous mesh repair of ventral abdominal wall
hernia.

Musculoskeletal: The bones appear osteopenic. There is a scoliosis
deformity involving the lumbar spine which is convex towards the
left. Compression fractures involve T9, L1, L2, L3 and L4. L5-S1
degenerative disc disease noted.

Review of the MIP images confirms the above findings.
IMPRESSION: 1. Examination is positive for large volume pulmonary embolus
including saddle embolus. Critical Value/emergent results were
called by telephone at the time of interpretation on 08/06/2015 at
[DATE] to Dr. SHALLEY [name] , who verbally acknowledged these results.
2. Airspace consolidation within the lung bases likely reflecting
pulmonary infarct.
3. No evidence for bowel obstruction.
4. Liver cysts and hemangioma
5. Aortic atherosclerosis
6. Compression fractures are noted involving the T9, L1, L2, L3, L4
vertebra. These are all age indeterminate. There are is also an old
sternal fracture.

## 2016-11-19 DIAGNOSIS — H9193 Unspecified hearing loss, bilateral: Secondary | ICD-10-CM | POA: Diagnosis not present

## 2016-11-19 DIAGNOSIS — F09 Unspecified mental disorder due to known physiological condition: Secondary | ICD-10-CM | POA: Diagnosis not present

## 2016-11-19 DIAGNOSIS — H6123 Impacted cerumen, bilateral: Secondary | ICD-10-CM | POA: Diagnosis not present

## 2018-04-26 NOTE — Progress Notes (Signed)
Appointment canceled; no charge. Reneka Nebergall NP-C [phone]  

## 2018-04-27 ENCOUNTER — Non-Acute Institutional Stay: Payer: Medicare PPO | Admitting: Internal Medicine

## 2018-04-29 ENCOUNTER — Encounter: Payer: Self-pay | Admitting: Internal Medicine

## 2018-04-29 ENCOUNTER — Non-Acute Institutional Stay: Payer: Medicare PPO | Admitting: Internal Medicine

## 2018-04-29 VITALS — BP 126/100 | HR 80 | Resp 20 | Ht 72.0 in | Wt 164.6 lb

## 2018-04-29 DIAGNOSIS — Z515 Encounter for palliative care: Secondary | ICD-10-CM

## 2018-04-29 NOTE — Progress Notes (Addendum)
[facility] Telephone: [phone] Fax: [phone]  PATIENT NAME: Craig Bennett DOB: 12/21/1929 MRN: [id number] Craig Bennett rm 711 (adm 12/21/15; readmit (03/25/16)  PRIMARY CARE / REFERRING PROVIDER: Dr. Georgann HousekeeperKarrar Bennett, Craig AnaGin Collins PA-C  RESPONSIBLE PARTY: (guardian) Craig Bennett [facility] [place]) [phone]. (dtr) Darron DoomMichelle Williamson (lives in [place]) [phone]. (otherVerlee Rossetti) Craig Bennett [phone]  ASSESSMENT:      Weight 178.2lbs (03/16/18). Height 6'  1 L IVF bolus 04/28/18  RECOMMENDATIONS and PLAN:  1. Alzheimer's dementia with behavior disturbances; Increased somnolence 2/2 dehydration and UTI. Patient is somnolent and difficult to arouse this am. Over the last week he has been too weak to ambulate (baseline wanders with unsteady gait and sometimes with use of walker/wanderguard in place), and has been wheelchair bound. Elevated serum sodium (04/27/2018) of 150, and BUN  34.3. Creatine ok at 0.79.  He received 1L 1/2 NS bolus. Labs today are pending. UA just returned as positive; scheduled to start antibiotics. Patient is followed by Psych NP Craig Bennett. At baseline, patient is constantly confused; oriented to name only.  He is on Zoloft 150mg  qd,and recently started on topical Ativan 0.5mg  bid for physically combative behavior. He had been refusing personal care. Two weeks earlier was started on Depakote 125 tid. Baseline can feed himself but now too weak; staff report currently just sips and bites. His weight earlier this month was 164.6 lbs, which was a loss of 13.6 lbs (7.6% of his body weight) form the previous month. At 6' his BMI is 22.3 kg/m2, but he looks like he has had further weight loss with marked temporal and extremity adipose and muscular wasting.  2. Goals of Care: Craig Bennett ([facility]) has been in contact with patient's guardian. Patient is currently a full scope of treatment as patient has DSS guardianship. Craig is in the  process of obtaining DNR-paperwork completed and faxed. I did leave a voice message on the voice mail of (guardian) Craig Bennett [facility] [phone]) [phone], as well as on the voice mail of her supervisor Craig Bennett [phone]) to update on patient's cognitive and functional decline. Would explore possibility of a hospice referral. 1:35pm addendum: return TC form [facility] guardian Craig Bennett. Craig Bennett mentioned she actually had been in contact with Craig Bennett ([facility]) this morning and had e-mailed DNR packet to be completed by 2 providers. Dr. Donette Bennett was to fill out one part and sign in presence of a Notary. Craig Bennett gave her verbal approval for Hospice referral, should it be recommended. We would need to fax her tha referral form to [phone], for her signature.   3. F/U: NP visit in 1-2 weeks.  I spent 120 minutes providing this consultation,  from 9:30 am to 11:30am. More than 50% of the time in this consultation was spent coordinating communication, interview of staff.   HISTORY OF PRESENT ILLNESS:  Craig Bennett is a 85 y.o. male with medical h/o dementia with mood disorder, pneumonia/sesis, acute on chronic kidne disease, chronic diastolic CHF, saddle pulmonary embolism (anticoagulation), protein calorie malnutrition, prostate cancer, and atrial frbrillation.Palliative Care was asked to help address goals of care.   CODE STATUS: MOST form on chart: Yes to CPR. Full Scope of medical interventions. Antibiotics and IVFs if indicated.   PPS: 20% (from 40% 1-2 weeks ago  HOSPICE ELIGIBILITY/DIAGNOSIS: yes; dementia, protein calorie malnutrition  PAST MEDICAL HISTORY:  Past Medical History:  Diagnosis Date  . A-fib (HCC)    a. CHA2DS2VASc =  2, prev on coumadin but d/c'd ~ 07/2014 - told he was in sinus rhythm;  Bennett. 09/2014 amio d/c'd in setting of persistent afib and bradycardia  . Chronic diastolic CHF (congestive heart failure) (HCC)   . CKD (chronic kidney disease),  stage II   . COPD (chronic obstructive pulmonary disease) (HCC)   . Dementia (HCC)    a. 09/2014 Aricept d/c'd in setting of bradycardia.  Marland Kitchen History of echocardiogram    a. 11/2008 Echo: EF 60-65%, no rwma, mild LVH, triv AI, mild MR, mildly dil LA/RA.  Marland Kitchen Low testosterone   . PE (pulmonary embolism)   . Renal insufficiency   . Syncope    a. 09/2014    SOCIAL HX:  Social History   Tobacco Use  . Smoking status: Never Smoker  . Smokeless tobacco: Never Used  Substance Use Topics  . Alcohol use: Yes    Comment: "very little, occasional wine with food."    ALLERGIES: No Known Allergies   PERTINENT MEDICATIONS:  Outpatient Encounter Medications as of 04/29/2018  Medication Sig  . acetaminophen (TYLENOL) 325 MG tablet Take 2 tablets (650 mg total) by mouth every 6 (six) hours as needed for mild pain (or Fever >/= 101). (Patient taking differently: Take 650 mg by mouth every 6 (six) hours as needed for mild pain. )  . Cholecalciferol 1.25 MG (50000 UT) capsule Take 50,000 Units by mouth every 30 (thirty) days.  . divalproex (DEPAKOTE) 125 MG DR tablet Take 125 mg by mouth 3 (three) times daily.  Marland Kitchen lisinopril (PRINIVIL,ZESTRIL) 20 MG tablet Take 20 mg by mouth daily.  Marland Kitchen oxybutynin (DITROPAN) 5 MG tablet Take 5 mg by mouth daily.  . rivaroxaban (XARELTO) 20 MG TABS tablet Take 1 tablet (20 mg total) by mouth daily with supper.  . sertraline (ZOLOFT) 100 MG tablet Take 100 mg by mouth daily.  . sertraline (ZOLOFT) 50 MG tablet Take 50 mg by mouth daily.  Marland Kitchen therapeutic multivitamin-minerals (THERAGRAN-M) tablet Take 1 tablet by mouth at bedtime.  . [DISCONTINUED] atenolol (TENORMIN) 25 MG tablet Take 0.5 tablets (12.5 mg total) by mouth daily.  . [DISCONTINUED] guaiFENesin (MUCINEX) 600 MG 12 hr tablet Take 600 mg by mouth 2 (two) times daily.  . [DISCONTINUED] levofloxacin (LEVAQUIN) 750 MG tablet Take 1 tablet (750 mg total) by mouth daily.  . [DISCONTINUED] memantine (NAMENDA) 5 MG tablet  Take 1 tablet (5 mg total) by mouth daily.   No facility-administered encounter medications on file as of 04/29/2018.     PHYSICAL EXAM:  VS: BP 126/100, HR 80, RR 20 Elderly, frail, very thin Caucasian male lying supine in bed. He was difficult to arouse; somnolent. Cardiovascular: regular rate and rhythm, occasional early beats Pulmonary: clear ant . Expiratory harsh upper airway sounds Abdomen: soft, nontender, + bowel sounds GU: diaper in tact Extremities: no edema, no joint deformities. Changes of chronic LE venous stasis.  Skin: no rashes.  Neurological: He was very difficult to awaken; took a bit of verbal and tactile stimulation. Did finally open his eyes and push me away, then back to sleep.  Anselm Lis, NP

## 2018-07-30 DEATH — deceased
